# Patient Record
Sex: Female | Born: 1985 | Hispanic: No | Marital: Married | Smoking: Never smoker
Health system: Southern US, Community
[De-identification: ages and names within clinical notes are randomized; demographics above are authoritative.]

## PROBLEM LIST (undated history)

## (undated) DIAGNOSIS — R519 Headache, unspecified: Secondary | ICD-10-CM

## (undated) DIAGNOSIS — I10 Essential (primary) hypertension: Secondary | ICD-10-CM

## (undated) DIAGNOSIS — O139 Gestational [pregnancy-induced] hypertension without significant proteinuria, unspecified trimester: Secondary | ICD-10-CM

## (undated) DIAGNOSIS — Z8669 Personal history of other diseases of the nervous system and sense organs: Secondary | ICD-10-CM

## (undated) DIAGNOSIS — R51 Headache: Secondary | ICD-10-CM

## (undated) HISTORY — DX: Gestational (pregnancy-induced) hypertension without significant proteinuria, unspecified trimester: O13.9

## (undated) HISTORY — DX: Personal history of other diseases of the nervous system and sense organs: Z86.69

## (undated) HISTORY — PX: BREAST BIOPSY: SHX20

---

## 2011-12-23 DIAGNOSIS — N6019 Diffuse cystic mastopathy of unspecified breast: Secondary | ICD-10-CM | POA: Insufficient documentation

## 2012-10-17 DIAGNOSIS — F419 Anxiety disorder, unspecified: Secondary | ICD-10-CM | POA: Insufficient documentation

## 2012-11-14 DIAGNOSIS — F329 Major depressive disorder, single episode, unspecified: Secondary | ICD-10-CM | POA: Insufficient documentation

## 2012-11-14 DIAGNOSIS — F32A Depression, unspecified: Secondary | ICD-10-CM | POA: Insufficient documentation

## 2013-06-20 NOTE — L&D Delivery Note (Signed)
Delivery Note At 5:53 AM a viable female was delivered via SVD  (Presentation: cephalic; ROA ).  APGAR: 9, 9; weight pending .   Placenta status: spontaneous , intact.  Cord:  3 vessel with the following complications: none.  Cord pH: pending  Anesthesia:  none Episiotomy:  none Lacerations:  none Suture Repair: n/a Est. Blood Loss (mL):  200  28 y.o. G2P1001 @[redacted]w[redacted]d  admitted for IOL for preeclampsia with severe range BPs.  Pt had FB, Cytotec, and Pitocin and progressed to 5 cm.  AROM performed, then variables started on FHR tracing.  Amnioinfusion started, then pt rapidly progressed to 10 cm and delivered.  Infant vigorous and placed in mothers arms after delivery.  Since delivery was rapid, NICU team called to room after delivery. Cord clamping delayed by several minutes, then clamped by MD and cut by FOB before infant taken to warmer for evaluation by NICU team.  Placenta spontaneous and intact.  Pitocin IV given after placenta delivery.  Pt given time to hold infant again prior to transfer to NICU.  FOB to NICU with baby.   Mom to postpartum.  Baby to NICU.  Kara Brady, Angela 06/04/2014, 6:09 AM   I have seen this patient and agree with the above resident's note.  LEFTWICH-KIRBY, LISA Certified Nurse-Midwife

## 2014-01-31 ENCOUNTER — Encounter: Payer: Self-pay | Admitting: Obstetrics and Gynecology

## 2014-01-31 ENCOUNTER — Ambulatory Visit (INDEPENDENT_AMBULATORY_CARE_PROVIDER_SITE_OTHER): Payer: Managed Care, Other (non HMO) | Admitting: Obstetrics and Gynecology

## 2014-01-31 VITALS — BP 121/80 | HR 83 | Ht 62.0 in | Wt 185.0 lb

## 2014-01-31 DIAGNOSIS — Z3482 Encounter for supervision of other normal pregnancy, second trimester: Secondary | ICD-10-CM | POA: Insufficient documentation

## 2014-01-31 DIAGNOSIS — R87612 Low grade squamous intraepithelial lesion on cytologic smear of cervix (LGSIL): Secondary | ICD-10-CM

## 2014-01-31 DIAGNOSIS — Z1151 Encounter for screening for human papillomavirus (HPV): Secondary | ICD-10-CM

## 2014-01-31 DIAGNOSIS — Z124 Encounter for screening for malignant neoplasm of cervix: Secondary | ICD-10-CM

## 2014-01-31 DIAGNOSIS — Z3480 Encounter for supervision of other normal pregnancy, unspecified trimester: Secondary | ICD-10-CM

## 2014-01-31 LAB — OB RESULTS CONSOLE HGB/HCT, BLOOD
HEMATOCRIT: 36 %
HEMOGLOBIN: 12.4 g/dL

## 2014-01-31 LAB — OB RESULTS CONSOLE HIV ANTIBODY (ROUTINE TESTING): HIV: NONREACTIVE

## 2014-01-31 LAB — OB RESULTS CONSOLE GC/CHLAMYDIA
Chlamydia: NEGATIVE
GC PROBE AMP, GENITAL: NEGATIVE

## 2014-01-31 LAB — OB RESULTS CONSOLE RPR: RPR: NONREACTIVE

## 2014-01-31 LAB — OB RESULTS CONSOLE PLATELET COUNT: PLATELETS: 306 10*3/uL

## 2014-01-31 MED ORDER — PRENATAL VITAMINS PLUS 27-1 MG PO TABS: 1.0000 | ORAL_TABLET | ORAL | Status: DC

## 2014-01-31 NOTE — Progress Notes (Signed)
   Subjective:    Kara Brady is a G2P0001 3056w3d being seen today for her first obstetrical visit.  Her obstetrical history is significant for term NSVD AGA. [place]. Patient does intend to breast feed. Pregnancy history fully reviewed.  Patient reports no complaints.  Filed Vitals:   01/31/14 1008 01/31/14 1009  BP: 121/80   Pulse: 83   Height:  5\' 2"  (1.575 m)  Weight: 185 lb (83.915 kg)     HISTORY: OB History  Gravida Para Term Preterm AB SAB TAB Ectopic Multiple Living  2 1   0     1    # Outcome Date GA Lbr Len/2nd Weight Sex Delivery Anes PTL Lv  2 CUR           1 PAR 01/01/10 7144w1d  6 lb 13 oz (3.09 kg) M SVD        History reviewed. No pertinent past medical history. Past Surgical History  Procedure Laterality Date  . Breast biopsy     Family History  Problem Relation Age of Onset  . Hypertension Mother   . Diabetes Paternal Grandfather      Exam    Uterus:   16 wk size DT 155  Pelvic Exam:    Perineum: Normal Perineum   Vulva: normal, Bartholin's, Urethra, Skene's normal   Vagina:  normal mucosa, normal discharge       Cervix: multiparous appearance and Nabothian cyst ant lip L/C   Adnexa: not evaluated   Bony Pelvis: average  System: Breast:  normal appearance, no masses or tenderness scar left breast d/t benigh lump removed 2 yr ago   Skin: normal coloration and turgor, no rashes    Neurologic: oriented, normal, grossly non-focal   Extremities: normal strength, tone, and muscle mass   HEENT PERRLA   Mouth/Teeth mucous membranes moist, pharynx normal without lesions and dental hygiene good   Neck supple, no masses and thyroid NS   Cardiovascular: regular rate and rhythm, no murmurs or gallops   Respiratory:  appears well, vitals normal, no respiratory distress, acyanotic, normal RR, ear and throat exam is normal, neck free of mass or lymphadenopathy, chest clear, no wheezing, crepitations, rhonchi, normal symmetric air entry   Abdomen:  soft, non-tender; bowel sounds normal; no masses,  no organomegaly   Urinary: urethral meatus normal      Assessment:    Pregnancy: G2P0001 Patient Active Problem List   Diagnosis Date Noted  . Supervision of normal intrauterine pregnancy in multigravida in second trimester 01/31/2014        Plan:     Initial labs drawn. Prenatal vitamins Rx Problem list reviewed and updated. Genetic Screening discussed Quad Screen: declined.  Ultrasound discussed; fetal survey: requested. Will schedule  Follow up in 4 weeks. 50% of 30 min visit spent on counseling and coordination of care.     Kara Brady 01/31/2014

## 2014-01-31 NOTE — Patient Instructions (Signed)
Second Trimester of Pregnancy The second trimester is from week 13 through week 28, months 4 through 6. The second trimester is often a time when you feel your best. Your body has also adjusted to being pregnant, and you begin to feel better physically. Usually, morning sickness has lessened or quit completely, you may have more energy, and you may have an increase in appetite. The second trimester is also a time when the fetus is growing rapidly. At the end of the sixth month, the fetus is about 9 inches long and weighs about 1 pounds. You will likely begin to feel the baby move (quickening) between 18 and 20 weeks of the pregnancy. BODY CHANGES Your body goes through many changes during pregnancy. The changes vary from woman to woman.   Your weight will continue to increase. You will notice your lower abdomen bulging out.  You may begin to get stretch marks on your hips, abdomen, and breasts.  You may develop headaches that can be relieved by medicines approved by your health care provider.  You may urinate more often because the fetus is pressing on your bladder.  You may develop or continue to have heartburn as a result of your pregnancy.  You may develop constipation because certain hormones are causing the muscles that push waste through your intestines to slow down.  You may develop hemorrhoids or swollen, bulging veins (varicose veins).  You may have back pain because of the weight gain and pregnancy hormones relaxing your joints between the bones in your pelvis and as a result of a shift in weight and the muscles that support your balance.  Your breasts will continue to grow and be tender.  Your gums may bleed and may be sensitive to brushing and flossing.  Dark spots or blotches (chloasma, mask of pregnancy) may develop on your face. This will likely fade after the baby is born.  A dark line from your belly button to the pubic area (linea nigra) may appear. This will likely fade  after the baby is born.  You may have changes in your hair. These can include thickening of your hair, rapid growth, and changes in texture. Some women also have hair loss during or after pregnancy, or hair that feels dry or thin. Your hair will most likely return to normal after your baby is born. WHAT TO EXPECT AT YOUR PRENATAL VISITS During a routine prenatal visit:  You will be weighed to make sure you and the fetus are growing normally.  Your blood pressure will be taken.  Your abdomen will be measured to track your baby's growth.  The fetal heartbeat will be listened to.  Any test results from the previous visit will be discussed. Your health care provider may ask you:  How you are feeling.  If you are feeling the baby move.  If you have had any abnormal symptoms, such as leaking fluid, bleeding, severe headaches, or abdominal cramping.  If you have any questions. Other tests that may be performed during your second trimester include:  Blood tests that check for:  Low iron levels (anemia).  Gestational diabetes (between 24 and 28 weeks).  Rh antibodies.  Urine tests to check for infections, diabetes, or protein in the urine.  An ultrasound to confirm the proper growth and development of the baby.  An amniocentesis to check for possible genetic problems.  Fetal screens for spina bifida and Down syndrome. HOME CARE INSTRUCTIONS   Avoid all smoking, herbs, alcohol, and unprescribed   drugs. These chemicals affect the formation and growth of the baby.  Follow your health care provider's instructions regarding medicine use. There are medicines that are either safe or unsafe to take during pregnancy.  Exercise only as directed by your health care provider. Experiencing uterine cramps is a good sign to stop exercising.  Continue to eat regular, healthy meals.  Wear a good support bra for breast tenderness.  Do not use hot tubs, steam rooms, or saunas.  Wear your  seat belt at all times when driving.  Avoid raw meat, uncooked cheese, cat litter boxes, and soil used by cats. These carry germs that can cause birth defects in the baby.  Take your prenatal vitamins.  Try taking a stool softener (if your health care provider approves) if you develop constipation. Eat more high-fiber foods, such as fresh vegetables or fruit and whole grains. Drink plenty of fluids to keep your urine clear or pale yellow.  Take warm sitz baths to soothe any pain or discomfort caused by hemorrhoids. Use hemorrhoid cream if your health care provider approves.  If you develop varicose veins, wear support hose. Elevate your feet for 15 minutes, 3-4 times a day. Limit salt in your diet.  Avoid heavy lifting, wear low heel shoes, and practice good posture.  Rest with your legs elevated if you have leg cramps or low back pain.  Visit your dentist if you have not gone yet during your pregnancy. Use a soft toothbrush to brush your teeth and be gentle when you floss.  A sexual relationship may be continued unless your health care provider directs you otherwise.  Continue to go to all your prenatal visits as directed by your health care provider. SEEK MEDICAL CARE IF:   You have dizziness.  You have mild pelvic cramps, pelvic pressure, or nagging pain in the abdominal area.  You have persistent nausea, vomiting, or diarrhea.  You have a bad smelling vaginal discharge.  You have pain with urination. SEEK IMMEDIATE MEDICAL CARE IF:   You have a fever.  You are leaking fluid from your vagina.  You have spotting or bleeding from your vagina.  You have severe abdominal cramping or pain.  You have rapid weight gain or loss.  You have shortness of breath with chest pain.  You notice sudden or extreme swelling of your face, hands, ankles, feet, or legs.  You have not felt your baby move in over an hour.  You have severe headaches that do not go away with  medicine.  You have vision changes. Document Released: 05/31/2001 Document Revised: 06/11/2013 Document Reviewed: 08/07/2012 ExitCare Patient Information 2015 [employer]. This information is not intended to replace advice given to you by your health care provider. Make sure you discuss any questions you have with your health care provider.  

## 2014-02-01 LAB — OBSTETRIC PANEL
Antibody Screen: NEGATIVE
Basophils Absolute: 0 10*3/uL (ref 0.0–0.1)
Basophils Relative: 0 % (ref 0–1)
EOS PCT: 0 % (ref 0–5)
Eosinophils Absolute: 0 10*3/uL (ref 0.0–0.7)
HCT: 36.1 % (ref 36.0–46.0)
HEMOGLOBIN: 12.4 g/dL (ref 12.0–15.0)
HEP B S AG: NEGATIVE
LYMPHS ABS: 2.9 10*3/uL (ref 0.7–4.0)
LYMPHS PCT: 33 % (ref 12–46)
MCH: 29.6 pg (ref 26.0–34.0)
MCHC: 34.3 g/dL (ref 30.0–36.0)
MCV: 86.2 fL (ref 78.0–100.0)
Monocytes Absolute: 0.6 10*3/uL (ref 0.1–1.0)
Monocytes Relative: 7 % (ref 3–12)
NEUTROS PCT: 60 % (ref 43–77)
Neutro Abs: 5.3 10*3/uL (ref 1.7–7.7)
Platelets: 306 10*3/uL (ref 150–400)
RBC: 4.19 MIL/uL (ref 3.87–5.11)
RDW: 14.3 % (ref 11.5–15.5)
RUBELLA: 0.88 {index} (ref <0.90)
Rh Type: POSITIVE
WBC: 8.8 10*3/uL (ref 4.0–10.5)

## 2014-02-01 LAB — HIV ANTIBODY (ROUTINE TESTING W REFLEX): HIV 1&2 Ab, 4th Generation: NONREACTIVE

## 2014-02-01 LAB — GC/CHLAMYDIA PROBE AMP
CT Probe RNA: NEGATIVE
GC Probe RNA: NEGATIVE

## 2014-02-02 LAB — CULTURE, OB URINE
COLONY COUNT: NO GROWTH
ORGANISM ID, BACTERIA: NO GROWTH

## 2014-02-03 LAB — CYTOLOGY - PAP

## 2014-02-05 DIAGNOSIS — R87612 Low grade squamous intraepithelial lesion on cytologic smear of cervix (LGSIL): Secondary | ICD-10-CM | POA: Insufficient documentation

## 2014-03-03 ENCOUNTER — Ambulatory Visit (INDEPENDENT_AMBULATORY_CARE_PROVIDER_SITE_OTHER): Payer: Managed Care, Other (non HMO) | Admitting: Obstetrics & Gynecology

## 2014-03-03 ENCOUNTER — Ambulatory Visit (HOSPITAL_COMMUNITY)
Admission: RE | Admit: 2014-03-03 | Discharge: 2014-03-03 | Disposition: A | Discharge status: Home / Self Care | Payer: Managed Care, Other (non HMO) | Source: Ambulatory Visit | Attending: Obstetrics and Gynecology | Admitting: Obstetrics and Gynecology

## 2014-03-03 ENCOUNTER — Encounter: Payer: Self-pay | Admitting: Obstetrics & Gynecology

## 2014-03-03 VITALS — BP 128/81 | HR 91 | Wt 189.0 lb

## 2014-03-03 DIAGNOSIS — R87612 Low grade squamous intraepithelial lesion on cytologic smear of cervix (LGSIL): Secondary | ICD-10-CM

## 2014-03-03 DIAGNOSIS — Z3482 Encounter for supervision of other normal pregnancy, second trimester: Secondary | ICD-10-CM

## 2014-03-03 DIAGNOSIS — Z3689 Encounter for other specified antenatal screening: Secondary | ICD-10-CM | POA: Diagnosis not present

## 2014-03-03 NOTE — Progress Notes (Signed)
  Colposcopy Procedure Note  Indications: Pap smear 1 months ago showed: low-grade squamous intraepithelial neoplasia (LGSIL - encompassing HPV,mild dysplasia,CIN I) with HPV HR positive.  Pt denies other abnormal pap smears or procedures  Procedure Details  The risks and benefits of the procedure and Written informed consent obtained.  Speculum placed in vagina and excellent visualization of cervix achieved, cervix swabbed x 3 with acetic acid solution.  Findings: Cervix: acetowhite lesion(s) noted at 3 o'clock; cervix swabbed with Lugol's solution, SCJ visualized - lesion at 3 o'clock and cervical biopsies taken at 3 o'clock. Vaginal inspection: vaginal colposcopy not performed. Vulvar colposcopy: vulvar colposcopy not performed.  Specimens: difficult cervical biopsy taken at 3 o clock  Complications: none.  Plan: Specimens labelled and sent to Pathology. Will base further treatment on Pathology findings.

## 2014-03-03 NOTE — Progress Notes (Signed)
Pt to have Colpo today - Pt c/o of nausea an vomiting and would like to change PNV to specifically Cintranatal and also wants medication for nausea

## 2014-03-03 NOTE — Addendum Note (Signed)
Addended by: Arne Cleveland on: 03/03/2014 02:22 PM   Modules accepted: Orders

## 2014-03-10 ENCOUNTER — Ambulatory Visit (INDEPENDENT_AMBULATORY_CARE_PROVIDER_SITE_OTHER): Payer: Managed Care, Other (non HMO) | Admitting: Advanced Practice Midwife

## 2014-03-10 ENCOUNTER — Telehealth: Payer: Self-pay | Admitting: *Deleted

## 2014-03-10 ENCOUNTER — Encounter: Payer: Self-pay | Admitting: *Deleted

## 2014-03-10 ENCOUNTER — Encounter: Payer: Self-pay | Admitting: Obstetrics & Gynecology

## 2014-03-10 VITALS — BP 115/73 | HR 81 | Wt 190.0 lb

## 2014-03-10 DIAGNOSIS — Z3480 Encounter for supervision of other normal pregnancy, unspecified trimester: Secondary | ICD-10-CM

## 2014-03-10 DIAGNOSIS — O26892 Other specified pregnancy related conditions, second trimester: Secondary | ICD-10-CM

## 2014-03-10 DIAGNOSIS — R51 Headache: Secondary | ICD-10-CM

## 2014-03-10 DIAGNOSIS — O9989 Other specified diseases and conditions complicating pregnancy, childbirth and the puerperium: Secondary | ICD-10-CM

## 2014-03-10 MED ORDER — CYCLOBENZAPRINE HCL 10 MG PO TABS: 5.0000 mg | ORAL_TABLET | Freq: Three times a day (TID) | ORAL | Status: DC | PRN

## 2014-03-10 MED ORDER — CVS PRENATAL GUMMY 0.4-113.5 MG PO CHEW: 2.0000 | CHEWABLE_TABLET | Freq: Every day | ORAL | Status: DC

## 2014-03-10 NOTE — Telephone Encounter (Signed)
LM on voiceamil of LGSIL and no further testing at this time.  Will repeat pap in 1 year.

## 2014-03-10 NOTE — Progress Notes (Signed)
Pt here due to headache X1 month - pt states she takes about 3 tylenol each day for relief, pt experiences dizziness and anxiety. Sx begin each day around 2:00pm.

## 2014-03-10 NOTE — Telephone Encounter (Signed)
Message copied by Granville Lewis on Mon Mar 10, 2014 12:08 PM ------      Message from: Lesly Dukes      Created: Mon Mar 10, 2014 10:22 AM       Biopsy showed low grade.  No further testing to be done in pregnancy.  Will rpt pap in one year. ------

## 2014-03-10 NOTE — Progress Notes (Signed)
Doing well.  Good fetal movement, denies vaginal bleeding, LOF, regular contractions.  Reports daily headaches, accompanied by nausea.  No hx migraines but had intermittent headaches prior to pregnancy. Also, she often vomits after prenatal vitamin, has tried taking with food, taking at night, etc.  Rx for Flexeril 5-10 mg TID.  Appt to see Bonita Quin r/t headaches.  Change prenatal vitamin to gummy vitamin.  Discussed lack of iron in gummy vitamin.  Will reevaluate after 28 week labs.

## 2014-03-25 ENCOUNTER — Ambulatory Visit (INDEPENDENT_AMBULATORY_CARE_PROVIDER_SITE_OTHER): Payer: Managed Care, Other (non HMO) | Admitting: Nurse Practitioner

## 2014-03-25 ENCOUNTER — Encounter: Payer: Self-pay | Admitting: Nurse Practitioner

## 2014-03-25 VITALS — BP 144/94 | HR 81 | Resp 16 | Ht 62.0 in | Wt 196.0 lb

## 2014-03-25 DIAGNOSIS — F32A Depression, unspecified: Secondary | ICD-10-CM

## 2014-03-25 DIAGNOSIS — G43009 Migraine without aura, not intractable, without status migrainosus: Secondary | ICD-10-CM | POA: Insufficient documentation

## 2014-03-25 DIAGNOSIS — F419 Anxiety disorder, unspecified: Secondary | ICD-10-CM

## 2014-03-25 DIAGNOSIS — F329 Major depressive disorder, single episode, unspecified: Secondary | ICD-10-CM

## 2014-03-25 DIAGNOSIS — G43019 Migraine without aura, intractable, without status migrainosus: Secondary | ICD-10-CM

## 2014-03-25 MED ORDER — SUMATRIPTAN SUCCINATE 100 MG PO TABS: 100.0000 mg | ORAL_TABLET | Freq: Once | ORAL | Status: DC | PRN

## 2014-03-25 MED ORDER — PROMETHAZINE HCL 25 MG PO TABS: 25.0000 mg | ORAL_TABLET | Freq: Four times a day (QID) | ORAL | Status: DC | PRN

## 2014-03-25 MED ORDER — VENLAFAXINE HCL ER 75 MG PO CP24: 75.0000 mg | ORAL_CAPSULE | Freq: Every day | ORAL | Status: DC

## 2014-03-25 NOTE — Progress Notes (Signed)
FHT 141 and fundal height today is 25 mm.  Pt is to return next week for her routine prenatal visit.

## 2014-03-25 NOTE — Progress Notes (Signed)
Diagnosis:  Chronic Migraine without Aura, Anxiety, Depression, Pregnancy  History: Kara Brady 28 y.o. G2P0001 2650w0d [facility] for migraine consultation. She has been having almost daily migraines since her 20's. She has had CT/MRI she can not recall which. She also has some vertigo and anxiety along with migraine. 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 She has a 29 year old at home that is giving her a lot of issues. She has a new/ good marriage. They have some debt and she worries a good deal about money. She has a history of anxiety and depression. Her sleep is good. She has been given flexeril for migraine but it has not been effective.      Location: Right and left [place]  Number of Headache days/month: Daily Severe: 20 Moderate: 5 Mild:10  Current Outpatient Prescriptions on File Prior to Visit  Medication Sig Dispense Refill  . acetaminophen (TYLENOL) 500 MG tablet Take 500 mg by mouth every 6 (six) hours as needed.      . cyclobenzaprine (FLEXERIL) 10 MG tablet Take 0.5-1 tablets (5-10 mg total) by mouth 3 (three) times daily as needed for muscle spasms.  30 tablet  2  . Prenatal Vit-Min-FA-Fish Oil ([facility] PRENATAL GUMMY) 0.4-113.5 MG CHEW Chew 2 tablets by mouth daily.  60 tablet  10   No current facility-administered medications on file prior to visit.    Acute prevention: Flexeril  Past Medical History  Diagnosis Date  . History of migraine headaches    Past Surgical History  Procedure Laterality Date  . Breast biopsy     Family History  Problem Relation Age of Onset  . Hypertension Mother   . Diabetes Paternal Grandfather    Social History:  reports that she has never smoked. She does not have any smokeless tobacco history on file. She reports that she does not drink alcohol or use illicit drugs. Allergies: No Known Allergies  Triggers: Stress and anxiety  Birth control: Pregnant  ROS: Positive for Anxiety,  Chronic migraine, pregnancy, depression, vertigo. Negative for cardiac issues. Today BP elevated due to migraine.   Exam: Well developed, well nourished, Hispanic female,  General: [redacted] weeks pregnant, NAD, has migraine today HEENT:negative Cardiac:RRR Lungs:Clear Neuro:Negative Skin:Warm and dry  Impression:migraine - common/ Chronic and without aura Anxiety/ Depression Pregnancy 23 weeks  Plan: Discussed the pathophysiology of migraine and medication risks in pregnancy and she is willing to accept these risks. We have discussed treating her depression and anxiety with Effexor and she will take 1/2 tab for one week then up to one tab till seen again. Her BP was elevated today, but she has a migraine. She was asked to have her BP repeated before leaving office but slipped out before that was done. We will give her Imitrex and phenergan to treat severe migraine while she is at work. We discussed at length ways to decrease her stress including asking her husband for help, walking, taking baths, joining church, meditation. She has an OB visit in one week and she will follow up with me in one month.   Time Spent: 45 minutes

## 2014-03-25 NOTE — Patient Instructions (Signed)

## 2014-03-31 ENCOUNTER — Encounter: Payer: Managed Care, Other (non HMO) | Admitting: Advanced Practice Midwife

## 2014-04-22 ENCOUNTER — Encounter: Payer: Self-pay | Admitting: Nurse Practitioner

## 2014-04-22 ENCOUNTER — Ambulatory Visit (INDEPENDENT_AMBULATORY_CARE_PROVIDER_SITE_OTHER): Payer: Managed Care, Other (non HMO) | Admitting: Nurse Practitioner

## 2014-04-22 ENCOUNTER — Ambulatory Visit (INDEPENDENT_AMBULATORY_CARE_PROVIDER_SITE_OTHER): Payer: Managed Care, Other (non HMO) | Admitting: Obstetrics & Gynecology

## 2014-04-22 VITALS — BP 136/81 | HR 97 | Wt 201.0 lb

## 2014-04-22 VITALS — BP 136/81 | HR 97 | Resp 16 | Ht 64.0 in | Wt 201.0 lb

## 2014-04-22 DIAGNOSIS — R51 Headache: Secondary | ICD-10-CM

## 2014-04-22 DIAGNOSIS — F32A Depression, unspecified: Secondary | ICD-10-CM

## 2014-04-22 DIAGNOSIS — G43019 Migraine without aura, intractable, without status migrainosus: Secondary | ICD-10-CM

## 2014-04-22 DIAGNOSIS — Z3482 Encounter for supervision of other normal pregnancy, second trimester: Secondary | ICD-10-CM

## 2014-04-22 DIAGNOSIS — F329 Major depressive disorder, single episode, unspecified: Secondary | ICD-10-CM

## 2014-04-22 DIAGNOSIS — O26892 Other specified pregnancy related conditions, second trimester: Secondary | ICD-10-CM

## 2014-04-22 DIAGNOSIS — R7302 Impaired glucose tolerance (oral): Secondary | ICD-10-CM

## 2014-04-22 DIAGNOSIS — R519 Headache, unspecified: Secondary | ICD-10-CM

## 2014-04-22 DIAGNOSIS — Z23 Encounter for immunization: Secondary | ICD-10-CM

## 2014-04-22 DIAGNOSIS — R319 Hematuria, unspecified: Secondary | ICD-10-CM

## 2014-04-22 DIAGNOSIS — R7309 Other abnormal glucose: Secondary | ICD-10-CM

## 2014-04-22 DIAGNOSIS — F419 Anxiety disorder, unspecified: Secondary | ICD-10-CM

## 2014-04-22 DIAGNOSIS — Z3493 Encounter for supervision of normal pregnancy, unspecified, third trimester: Secondary | ICD-10-CM

## 2014-04-22 NOTE — Progress Notes (Signed)
Ketones -Trace  Blood -mod

## 2014-04-22 NOTE — Progress Notes (Signed)
History:  Kara Brady is a 28 y.o. G2P0001 who [facility] for follow up with migraine headache. She is up to 75 mg of Effexor and doing much better. She has been able to take her Imitrex and phenergan when she gets a migraine. She states she is feeling 75% less anxious and less headache. She would like to stay on Effexor as long as she can. She plans to breast feed.   The following portions of the patient's history were reviewed and updated as appropriate: allergies, current medications, past family history, past medical history, past social history, past surgical history and problem list.  Review of Systems:    Objective:  Physical Exam BP 136/81 mmHg  Pulse 97  Resp 16  Ht 5\' 4"  (1.626 m)  Wt 201 lb (91.173 kg)  BMI 34.48 kg/m2  LMP 10/15/2013 GENERAL: Well-developed, well-nourished female in no acute distress. Pregnant [redacted] weeks HEENT: Normocephalic, atraumatic.  NECK: Supple. Normal thyroid.  LUNGS: Normal rate. Clear to auscultation bilaterally.  HEART: Regular rate and rhythm with no adventitious sounds.   EXTREMITIES: No cyanosis, clubbing, or edema, 2+ distal pulses.   Labs and Imaging No results found.  Assessment & Plan:  Assessment:  Migraine Headache in Pregnancy/ Improved  Plans:  Pt would like to stay on Effexor as long as possible At week 37-38 she will take 1/2 Effexor dose for one week then discontinue She will call if she has any issues with weaning/ depression/ anxiety/ worsening migraines If she would like to replace Effexor with Zoloft after delivery will be ok with breast feeding Continue to keep Prenatal visits   Delbert PhenixLinda M Zakyra [place], NP 04/22/2014 2:33 PM

## 2014-04-22 NOTE — Progress Notes (Signed)
Routine visit. Good FM. Glucola, labs, tdap today.

## 2014-04-22 NOTE — Patient Instructions (Signed)

## 2014-04-23 ENCOUNTER — Telehealth: Payer: Self-pay | Admitting: *Deleted

## 2014-04-23 LAB — CBC
HEMATOCRIT: 35.8 % — AB (ref 36.0–46.0)
HEMOGLOBIN: 12 g/dL (ref 12.0–15.0)
MCH: 29.8 pg (ref 26.0–34.0)
MCHC: 33.5 g/dL (ref 30.0–36.0)
MCV: 88.8 fL (ref 78.0–100.0)
Platelets: 268 10*3/uL (ref 150–400)
RBC: 4.03 MIL/uL (ref 3.87–5.11)
RDW: 14.1 % (ref 11.5–15.5)
WBC: 10.1 10*3/uL (ref 4.0–10.5)

## 2014-04-23 LAB — HIV ANTIBODY (ROUTINE TESTING W REFLEX): HIV 1&2 Ab, 4th Generation: NONREACTIVE

## 2014-04-23 LAB — RPR

## 2014-04-23 LAB — GLUCOSE TOLERANCE, 1 HOUR (50G) W/O FASTING: Glucose, 1 Hour GTT: 180 mg/dL — ABNORMAL HIGH (ref 70–140)

## 2014-04-23 NOTE — Addendum Note (Signed)
Addended by: Granville LewisLARK, Torian Thoennes L on: 04/23/2014 02:12 PM   Modules accepted: Orders

## 2014-04-23 NOTE — Telephone Encounter (Signed)
Lm on voicemail of abnormal 1 hr GTT and needs to schedule a fasting 3 hr GTT.

## 2014-04-24 ENCOUNTER — Encounter: Payer: Self-pay | Admitting: Obstetrics & Gynecology

## 2014-04-24 DIAGNOSIS — O99810 Abnormal glucose complicating pregnancy: Secondary | ICD-10-CM | POA: Insufficient documentation

## 2014-04-24 LAB — CULTURE, URINE COMPREHENSIVE
Colony Count: NO GROWTH
ORGANISM ID, BACTERIA: NO GROWTH

## 2014-04-25 ENCOUNTER — Telehealth: Payer: Self-pay

## 2014-04-25 LAB — GLUCOSE TOLERANCE, 3 HOURS
GLUCOSE, FASTING-GESTATIONAL: 79 mg/dL (ref 70–104)
Glucose Tolerance, 1 hour: 125 mg/dL (ref 70–189)
Glucose Tolerance, 2 hour: 121 mg/dL (ref 70–164)
Glucose, GTT - 3 Hour: 119 mg/dL (ref 70–144)

## 2014-04-25 NOTE — Telephone Encounter (Signed)
Called patient to let her know her she passed her 3-hr gtt. Patient is ok with the results

## 2014-05-13 ENCOUNTER — Ambulatory Visit (INDEPENDENT_AMBULATORY_CARE_PROVIDER_SITE_OTHER): Payer: Managed Care, Other (non HMO) | Admitting: Obstetrics & Gynecology

## 2014-05-13 VITALS — BP 146/92 | HR 98 | Wt 212.0 lb

## 2014-05-13 DIAGNOSIS — Z3482 Encounter for supervision of other normal pregnancy, second trimester: Secondary | ICD-10-CM

## 2014-05-13 NOTE — Patient Instructions (Signed)

## 2014-05-13 NOTE — Progress Notes (Signed)
BP slightly elevated.  Will get labs, protein creatinine ratio, and 24 hour urine.  Pt will return tomorrow for BP check and if elevated will do NST.  Pt denies headache or other signs of severe preeclampsia.  Fetal kick counts.

## 2014-05-14 ENCOUNTER — Ambulatory Visit: Payer: Managed Care, Other (non HMO) | Admitting: *Deleted

## 2014-05-14 VITALS — BP 134/86 | HR 88

## 2014-05-14 DIAGNOSIS — I158 Other secondary hypertension: Secondary | ICD-10-CM

## 2014-05-20 LAB — CREATININE CLEARANCE, URINE, 24 HOUR
CREAT CLEAR: 157 mL/min — AB (ref 75–115)
Creatinine, 24H Ur: 1127 mg/d (ref 700–1800)
Creatinine, Urine: 66.3 mg/dL
Creatinine: 0.5 mg/dL (ref 0.50–1.10)

## 2014-05-20 LAB — COMPREHENSIVE METABOLIC PANEL
ALBUMIN: 3 g/dL — AB (ref 3.5–5.2)
ALT: 11 U/L (ref 0–35)
AST: 12 U/L (ref 0–37)
Alkaline Phosphatase: 93 U/L (ref 39–117)
BUN: 8 mg/dL (ref 6–23)
CHLORIDE: 105 meq/L (ref 96–112)
CO2: 20 mEq/L (ref 19–32)
Calcium: 8.6 mg/dL (ref 8.4–10.5)
Creat: 0.5 mg/dL (ref 0.50–1.10)
GLUCOSE: 101 mg/dL — AB (ref 70–99)
POTASSIUM: 4.1 meq/L (ref 3.5–5.3)
Sodium: 137 mEq/L (ref 135–145)
TOTAL PROTEIN: 5.7 g/dL — AB (ref 6.0–8.3)
Total Bilirubin: 0.2 mg/dL (ref 0.2–1.2)

## 2014-05-20 LAB — CBC
HEMATOCRIT: 36.2 % (ref 36.0–46.0)
Hemoglobin: 12.1 g/dL (ref 12.0–15.0)
MCH: 28.9 pg (ref 26.0–34.0)
MCHC: 33.4 g/dL (ref 30.0–36.0)
MCV: 86.6 fL (ref 78.0–100.0)
MPV: 10.9 fL (ref 9.4–12.4)
Platelets: 275 10*3/uL (ref 150–400)
RBC: 4.18 MIL/uL (ref 3.87–5.11)
RDW: 14.4 % (ref 11.5–15.5)
WBC: 10.2 10*3/uL (ref 4.0–10.5)

## 2014-05-20 LAB — PROTEIN, URINE, 24 HOUR
Protein, 24H Urine: 323 mg/d — ABNORMAL HIGH (ref <150)
Protein, Urine: 19 mg/dL (ref 5–24)

## 2014-05-20 LAB — PROTEIN / CREATININE RATIO, URINE
Creatinine, Urine: 66.3 mg/dL
PROTEIN CREATININE RATIO: 0.29 — AB (ref <0.15)
TOTAL PROTEIN, URINE: 19 mg/dL (ref 5–24)

## 2014-05-23 ENCOUNTER — Other Ambulatory Visit: Payer: Self-pay | Admitting: Obstetrics & Gynecology

## 2014-05-23 ENCOUNTER — Ambulatory Visit (HOSPITAL_COMMUNITY)
Admission: RE | Admit: 2014-05-23 | Discharge: 2014-05-23 | Disposition: A | Discharge status: Home / Self Care | Payer: Managed Care, Other (non HMO) | Source: Ambulatory Visit | Attending: Obstetrics & Gynecology | Admitting: Obstetrics & Gynecology

## 2014-05-23 ENCOUNTER — Telehealth: Payer: Self-pay | Admitting: *Deleted

## 2014-05-23 DIAGNOSIS — O1403 Mild to moderate pre-eclampsia, third trimester: Secondary | ICD-10-CM | POA: Diagnosis not present

## 2014-05-23 DIAGNOSIS — Z3A32 32 weeks gestation of pregnancy: Secondary | ICD-10-CM | POA: Insufficient documentation

## 2014-05-23 NOTE — Telephone Encounter (Signed)
Called pt to adv [facility] but pt did not answer the phone. LMOM for pt to rtn call to set up testing.

## 2014-05-23 NOTE — Telephone Encounter (Signed)
-----   Message from Lesly DukesKelly H Leggett, MD sent at 05/23/2014 11:38 AM EST ----- 24 hour urine meets mild preeclampsia.  Pt needs 2x week testing.  Since it is Friday, she needs BPP and growth.  BP check.

## 2014-05-23 NOTE — Progress Notes (Signed)
[facility] and BPP).  Reporting system is currently down.  Report of normal growth and 8/8 BPP called to Dr. Jolayne Pantheronstant at [phone].

## 2014-05-26 ENCOUNTER — Ambulatory Visit (HOSPITAL_COMMUNITY): Payer: Managed Care, Other (non HMO)

## 2014-05-26 ENCOUNTER — Ambulatory Visit (INDEPENDENT_AMBULATORY_CARE_PROVIDER_SITE_OTHER): Payer: Managed Care, Other (non HMO) | Admitting: Advanced Practice Midwife

## 2014-05-26 ENCOUNTER — Encounter: Payer: Self-pay | Admitting: Advanced Practice Midwife

## 2014-05-26 ENCOUNTER — Other Ambulatory Visit (HOSPITAL_COMMUNITY): Payer: Managed Care, Other (non HMO)

## 2014-05-26 VITALS — BP 144/91 | HR 85 | Wt 213.1 lb

## 2014-05-26 DIAGNOSIS — O1403 Mild to moderate pre-eclampsia, third trimester: Secondary | ICD-10-CM

## 2014-05-26 DIAGNOSIS — Z3483 Encounter for supervision of other normal pregnancy, third trimester: Secondary | ICD-10-CM

## 2014-05-26 NOTE — Patient Instructions (Signed)

## 2014-05-26 NOTE — Progress Notes (Signed)
Discussed mild preeclampsia. Will start twice a week testing. Will do NST tomorrow and AFI Friday. 24 hr Protein = 323, Prot/Cr Ratio = 0.29.  Denies headache or vision changes now. Trace edema. US on 05/23/14 showed normal growth and BPP score of 8/8.

## 2014-05-27 ENCOUNTER — Ambulatory Visit (INDEPENDENT_AMBULATORY_CARE_PROVIDER_SITE_OTHER): Payer: Managed Care, Other (non HMO) | Admitting: *Deleted

## 2014-05-27 VITALS — BP 147/90 | HR 96 | Wt 214.0 lb

## 2014-05-27 DIAGNOSIS — O1493 Unspecified pre-eclampsia, third trimester: Secondary | ICD-10-CM

## 2014-05-27 DIAGNOSIS — Z3A32 32 weeks gestation of pregnancy: Secondary | ICD-10-CM | POA: Insufficient documentation

## 2014-05-27 DIAGNOSIS — O1400 Mild to moderate pre-eclampsia, unspecified trimester: Secondary | ICD-10-CM | POA: Insufficient documentation

## 2014-05-27 LAB — AMNIOTIC FLUID INDEX: AFP AMN (MCG/ML): 10.5

## 2014-05-27 MED ORDER — BETAMETHASONE SOD PHOS & ACET 6 (3-3) MG/ML IJ SUSP
12.0000 mg | Freq: Once | INTRAMUSCULAR | Status: AC
  Administered 2014-05-27: 12 mg via INTRAMUSCULAR

## 2014-05-27 NOTE — Addendum Note (Signed)
Addended by: Arne ClevelandHUTCHINSON, Tymon Nemetz J on: 05/27/2014 02:18 PM   Modules accepted: Orders

## 2014-05-28 ENCOUNTER — Observation Stay (HOSPITAL_COMMUNITY)
Admission: AD | Admit: 2014-05-28 | Discharge: 2014-05-30 | Disposition: A | Discharge status: Home / Self Care | Payer: Managed Care, Other (non HMO) | Source: Ambulatory Visit | Attending: Obstetrics & Gynecology | Admitting: Obstetrics & Gynecology

## 2014-05-28 ENCOUNTER — Ambulatory Visit (INDEPENDENT_AMBULATORY_CARE_PROVIDER_SITE_OTHER): Payer: Managed Care, Other (non HMO) | Admitting: Obstetrics & Gynecology

## 2014-05-28 ENCOUNTER — Other Ambulatory Visit: Payer: Managed Care, Other (non HMO)

## 2014-05-28 ENCOUNTER — Encounter (HOSPITAL_COMMUNITY): Payer: Self-pay | Admitting: *Deleted

## 2014-05-28 VITALS — BP 178/101 | HR 85 | Wt 212.0 lb

## 2014-05-28 DIAGNOSIS — Z36 Encounter for antenatal screening of mother: Secondary | ICD-10-CM

## 2014-05-28 DIAGNOSIS — O1493 Unspecified pre-eclampsia, third trimester: Secondary | ICD-10-CM

## 2014-05-28 DIAGNOSIS — Z3A33 33 weeks gestation of pregnancy: Secondary | ICD-10-CM | POA: Insufficient documentation

## 2014-05-28 DIAGNOSIS — Z3482 Encounter for supervision of other normal pregnancy, second trimester: Secondary | ICD-10-CM

## 2014-05-28 DIAGNOSIS — O99353 Diseases of the nervous system complicating pregnancy, third trimester: Secondary | ICD-10-CM | POA: Insufficient documentation

## 2014-05-28 DIAGNOSIS — O10913 Unspecified pre-existing hypertension complicating pregnancy, third trimester: Secondary | ICD-10-CM | POA: Diagnosis present

## 2014-05-28 DIAGNOSIS — O1490 Unspecified pre-eclampsia, unspecified trimester: Secondary | ICD-10-CM | POA: Diagnosis present

## 2014-05-28 DIAGNOSIS — G43909 Migraine, unspecified, not intractable, without status migrainosus: Secondary | ICD-10-CM | POA: Insufficient documentation

## 2014-05-28 HISTORY — DX: Essential (primary) hypertension: I10

## 2014-05-28 HISTORY — DX: Headache: R51

## 2014-05-28 HISTORY — DX: Headache, unspecified: R51.9

## 2014-05-28 LAB — CBC
HCT: 37.3 % (ref 36.0–46.0)
Hemoglobin: 12.4 g/dL (ref 12.0–15.0)
MCH: 29.7 pg (ref 26.0–34.0)
MCHC: 33.2 g/dL (ref 30.0–36.0)
MCV: 89.4 fL (ref 78.0–100.0)
Platelets: 258 10*3/uL (ref 150–400)
RBC: 4.17 MIL/uL (ref 3.87–5.11)
RDW: 14.4 % (ref 11.5–15.5)
WBC: 12.7 10*3/uL — ABNORMAL HIGH (ref 4.0–10.5)

## 2014-05-28 LAB — URINALYSIS, ROUTINE W REFLEX MICROSCOPIC
BILIRUBIN URINE: NEGATIVE
Glucose, UA: NEGATIVE mg/dL
Ketones, ur: NEGATIVE mg/dL
Leukocytes, UA: NEGATIVE
NITRITE: NEGATIVE
PH: 6.5 (ref 5.0–8.0)
Protein, ur: 100 mg/dL — AB
Specific Gravity, Urine: 1.025 (ref 1.005–1.030)
Urobilinogen, UA: 0.2 mg/dL (ref 0.0–1.0)

## 2014-05-28 LAB — URINE MICROSCOPIC-ADD ON

## 2014-05-28 LAB — COMPREHENSIVE METABOLIC PANEL
ALT: 11 U/L (ref 0–35)
ANION GAP: 15 (ref 5–15)
AST: 13 U/L (ref 0–37)
Albumin: 2.5 g/dL — ABNORMAL LOW (ref 3.5–5.2)
Alkaline Phosphatase: 111 U/L (ref 39–117)
BUN: 10 mg/dL (ref 6–23)
CO2: 21 mEq/L (ref 19–32)
CREATININE: 0.54 mg/dL (ref 0.50–1.10)
Calcium: 9 mg/dL (ref 8.4–10.5)
Chloride: 103 mEq/L (ref 96–112)
GFR calc Af Amer: >90 mL/min (ref >90)
Glucose, Bld: 132 mg/dL — ABNORMAL HIGH (ref 70–99)
Potassium: 4.2 mEq/L (ref 3.7–5.3)
Sodium: 139 mEq/L (ref 137–147)
TOTAL PROTEIN: 6.4 g/dL (ref 6.0–8.3)
Total Bilirubin: <0.2 mg/dL — ABNORMAL LOW (ref 0.3–1.2)

## 2014-05-28 LAB — PROTEIN / CREATININE RATIO, URINE
Creatinine, Urine: 77.03 mg/dL
PROTEIN CREATININE RATIO: 1.21 — AB (ref 0.00–0.15)
Total Protein, Urine: 93.2 mg/dL

## 2014-05-28 MED ORDER — BETAMETHASONE SOD PHOS & ACET 6 (3-3) MG/ML IJ SUSP
12.0000 mg | Freq: Once | INTRAMUSCULAR | Status: AC
  Administered 2014-05-28: 12 mg via INTRAMUSCULAR

## 2014-05-28 MED ORDER — ACETAMINOPHEN 325 MG PO TABS
650.0000 mg | ORAL_TABLET | ORAL | Status: DC | PRN
  Administered 2014-05-29 – 2014-05-30 (×3): 650 mg via ORAL
  Filled 2014-05-28 (×3): qty 2

## 2014-05-28 MED ORDER — PRENATAL MULTIVITAMIN CH
1.0000 | ORAL_TABLET | Freq: Every day | ORAL | Status: DC
  Administered 2014-05-28 – 2014-05-29 (×2): 1 via ORAL
  Filled 2014-05-28 (×2): qty 1

## 2014-05-28 MED ORDER — CALCIUM CARBONATE ANTACID 500 MG PO CHEW: 2.0000 | CHEWABLE_TABLET | ORAL | Status: DC | PRN

## 2014-05-28 MED ORDER — PRENATAL MULTIVITAMIN CH: 1.0000 | ORAL_TABLET | Freq: Every day | ORAL | Status: DC

## 2014-05-28 MED ORDER — ZOLPIDEM TARTRATE 5 MG PO TABS: 5.0000 mg | ORAL_TABLET | Freq: Every evening | ORAL | Status: DC | PRN

## 2014-05-28 MED ORDER — DOCUSATE SODIUM 100 MG PO CAPS
100.0000 mg | ORAL_CAPSULE | Freq: Every day | ORAL | Status: DC
  Administered 2014-05-29 – 2014-05-30 (×2): 100 mg via ORAL
  Filled 2014-05-28 (×4): qty 1

## 2014-05-28 NOTE — Progress Notes (Signed)
Pt noticing some visual floaters today

## 2014-05-28 NOTE — MAU Note (Signed)
Pt sent from MD office for elevated BP.  Pt has HA, blurred vision, had one episode of vomiting.  Denies uc's, bleeding or LOF.

## 2014-05-28 NOTE — Plan of Care (Signed)
Problem: Phase I Progression Outcomes Goal: Initial discharge plan identified Outcome: Completed/Met Date Met:  05/28/14  Problem: Phase II Progression Outcomes Goal: Mattress in use Mattress in use ( Eggcrate, Med/Surg, Regular, Birthing Suite, Other)  Outcome: Completed/Met Date Met:  05/28/14 Goal: Output > 30 ml/hr or voiding qs Outcome: Completed/Met Date Met:  05/28/14

## 2014-05-28 NOTE — H&P (Signed)
Kara Brady is a 28 y.o. female G2P0001 @[redacted]w[redacted]d  pt of [place] presenting to MAU sent from the office for elevated BP, h/a, and blurred vision.  She reports she woke up this morning with nausea/vomiting and a headache.  Then, she started having blurred vision shortly after waking up.  She was seen at the office today and sent to MAU for further evaluation.    Pt denies h/a, n/v, or blurred vision while in MAU.  She was diagnosed with preeclampsia on 05/23/14 with elevated 24 hour urine and started twice weekly testing.  She received BMZ injections on 12/8 and completed course on 12/9 at 10:00 am.    Clinic  [facility]  Dating By 21 week U/S on 03/03/14  Genetic Screen declined  Anatomic US nl  Glucose 1 hour-180; 3 hr--all nml  TDaP vaccine 04/22/14  Flu vaccine   GBS   Contraception   Baby Food breast  Circumcision   Pediatrician   Support Person     Maternal Medical History:  Reason for admission: Nausea.  Contractions: Onset was less than 1 hour ago.      OB History    Gravida Para Term Preterm AB TAB SAB Ectopic Multiple Living   2 1   0     1     Past Medical History  Diagnosis Date  . History of migraine headaches   . Hypertension   . Headache    Past Surgical History  Procedure Laterality Date  . Breast biopsy     Family History: family history includes Diabetes in her paternal grandfather; Hypertension in her mother. Social History:  reports that she has never smoked. She does not have any smokeless tobacco history on file. She reports that she does not drink alcohol or use illicit drugs.   Prenatal Transfer Tool  Maternal Diabetes: No Genetic Screening: Declined Maternal Ultrasounds/Referrals: Normal Fetal Ultrasounds or other Referrals:  None Maternal Substance Abuse:  No Significant Maternal Medications:  None Significant Maternal Lab Results:  Lab values include: Other:  Other Comments:  GBS unknown  Review of Systems  Constitutional: Negative for fever,  chills and malaise/fatigue.  Eyes: Negative for blurred vision.  Respiratory: Negative for cough and shortness of breath.   Cardiovascular: Negative for chest pain.  Gastrointestinal: Negative for heartburn, nausea, vomiting and abdominal pain.  Genitourinary: Negative for dysuria, urgency and frequency.  Musculoskeletal: Negative.   Neurological: Negative for dizziness and headaches.  Psychiatric/Behavioral: Negative for depression.      Blood pressure 148/98, pulse 91, temperature 98 F (36.7 C), resp. rate 18, last menstrual period 10/15/2013, SpO2 98 %. Maternal Exam:  Uterine Assessment: Contraction frequency is rare.   Abdomen: Patient reports no abdominal tenderness.   Fetal Exam Fetal Monitor Review: Mode: ultrasound.   Baseline rate: 140.  Variability: moderate (6-25 bpm).   Pattern: accelerations present and no decelerations.    Fetal State Assessment: Category I - tracings are normal.     Physical Exam  Nursing note and vitals reviewed. Constitutional: She is oriented to person, place, and time. She appears well-developed and well-nourished.  Neck: Normal range of motion.  Cardiovascular: Normal rate, regular rhythm and normal heart sounds.   Respiratory: Effort normal.  GI: Soft.  Musculoskeletal: Normal range of motion.  Neurological: She is alert and oriented to person, place, and time. She has normal reflexes.  Skin: Skin is warm and dry.  Psychiatric: She has a normal mood and affect. Her behavior is normal. Judgment and thought  content normal.    Prenatal labs: ABO, Rh: O/POS/-- (08/14 1126) Antibody: NEG (08/14 1126) Rubella: 0.88 (08/14 1126) RPR: NON REAC (11/03 1501)  HBsAg: NEGATIVE (08/14 1126)  HIV: NONREACTIVE (11/03 1501)  GBS:   unkown  Assessment/Plan: 1. Preeclampsia, third trimester    Admit to antepartum 24 hour urine GBS pending, collected in office 12/9    LEFTWICH-KIRBY, Laurieanne Galloway 05/28/2014, 2:54 PM

## 2014-05-28 NOTE — Progress Notes (Signed)
Routine visit. Good FM. Complains of visual changes today. Cultures done today. She will go to MAU to determine if she need IOL today or care on the Antenatal unit.

## 2014-05-28 NOTE — Plan of Care (Signed)
Problem: Consults Goal: Antepartum Patient Education Outcome: Completed/Met Date Met:  05/28/14 Goal: Birthing Suites Patient Information Press F2 to bring up selections list  Pt < [redacted] weeks EGA Goal: Skin Care Protocol Initiated - if Braden Score 18 or less If consults are not indicated, leave blank or document N/A Outcome: Not Applicable Date Met:  77/03/40 Goal: Diabetes Guidelines per MD order/protocol Outcome: Not Applicable Date Met:  35/24/81 Goal: Physical Therapy Consult Outcome: Not Applicable Date Met:  85/90/93 Goal: Orientation to unit: [place] (smoking, visitation, chaplain services, helpline) Outcome: Completed/Met Date Met:  05/28/14

## 2014-05-29 DIAGNOSIS — O1493 Unspecified pre-eclampsia, third trimester: Secondary | ICD-10-CM

## 2014-05-29 LAB — CREATININE CLEARANCE, URINE, 24 HOUR
COLLECTION INTERVAL-CRCL: 24 h
CREATININE, URINE: 41.73 mg/dL
Creatinine Clearance: 225 mL/min — ABNORMAL HIGH (ref 75–115)
Creatinine, 24H Ur: 1753 mg/d (ref 700–1800)
Creatinine: 0.54 mg/dL (ref 0.50–1.10)
Urine Total Volume-CRCL: 4200 mL

## 2014-05-29 LAB — GC/CHLAMYDIA PROBE AMP
CT Probe RNA: NEGATIVE
GC Probe RNA: NEGATIVE

## 2014-05-29 NOTE — Progress Notes (Signed)
FACULTY PRACTICE ANTEPARTUM COMPREHENSIVE PROGRESS NOTE  Kara Brady is a 28 y.o. G2P1001 at 92w3dwho is admitted for evaluation of preeclampsia, rule out severe features.  Estimated Date of Delivery: 07/14/14.  She was diagnosed with preeclampsia on 05/23/14 with elevated 24 hour urine and started twice weekly testing. She received BMZ injections on 12/8 and completed course on 12/9 at 10:00 am.   Fetal presentation is cephalic.  Length of Stay:  1 Days. Admitted 05/28/2014  Subjective: Denies any headaches, visual changes or RUQ/abdominal pain. Patient reports good fetal movement.  She reports no uterine contractions, no bleeding and no loss of fluid per vagina.  Vitals:  Blood pressure 144/85, pulse 89, temperature 98.4 F (36.9 C), temperature source Oral, resp. rate 18, height _0  (1.575 m), weight 212 lb (96.163 kg), last menstrual period 10/15/2013, SpO2 98 %.  Patient Vitals for the past 24 hrs:  BP Temp Temp src Pulse Resp SpO2 Height Weight  05/28/14 2252 (!) 144/85 mmHg 98.4 F (36.9 C) Oral 89 18 - - -  05/28/14 2026 (!) 141/91 mmHg 97.5 F (36.4 C) Oral 88 18 - - -  05/28/14 1600 137/88 mmHg - - 87 20 - - -  05/28/14 1545 - - - - - - _1  (1.575 m) 212 lb (96.163 kg)  05/28/14 1539 (!) 159/97 mmHg 98.3 F (36.8 C) Oral 84 18 - - -  05/28/14 1242 148/98 mmHg - - 91 - - - -  05/28/14 1227 134/88 mmHg - - 84 - - - -  05/28/14 1212 129/89 mmHg - - 87 - - - -  05/28/14 1203 141/100 mmHg - - 87 - - - -  05/28/14 1133 162/97 mmHg 98 F (36.7 C) - 87 18 98 % - -   Physical Examination: General appearance: alert, cooperative and no distress  Abdomen: gravid, soft, non-tender  Extremities: No edema, DTR's 2+   Fetal monitoring: FHR: 130 bpm, Variability: moderate, Accelerations: Present, Decelerations: Absent  Uterine activity: none  Results for orders placed or performed during the hospital encounter of 05/28/14 (from the past 48 hour(s))  Urinalysis, Routine w reflex  microscopic     Status: Abnormal   Collection Time: 05/28/14 11:35 AM  Result Value Ref Range   Color, Urine YELLOW YELLOW   APPearance CLEAR CLEAR   Specific Gravity, Urine 1.025 1.005 - 1.030   pH 6.5 5.0 - 8.0   Glucose, UA NEGATIVE NEGATIVE mg/dL   Hgb urine dipstick SMALL (A) NEGATIVE   Bilirubin Urine NEGATIVE NEGATIVE   Ketones, ur NEGATIVE NEGATIVE mg/dL   Protein, ur 100 (A) NEGATIVE mg/dL   Urobilinogen, UA 0.2 0.0 - 1.0 mg/dL   Nitrite NEGATIVE NEGATIVE   Leukocytes, UA NEGATIVE NEGATIVE  Protein / creatinine ratio, urine     Status: Abnormal   Collection Time: 05/28/14 11:35 AM  Result Value Ref Range   Creatinine, Urine 77.03 mg/dL   Total Protein, Urine 93.2 mg/dL    Comment: NO NORMAL RANGE ESTABLISHED FOR THIS TEST   Protein Creatinine Ratio 1.21 (H) 0.00 - 0.15  Urine microscopic-add on     Status: Abnormal   Collection Time: 05/28/14 11:35 AM  Result Value Ref Range   Squamous Epithelial / LPF FEW (A) RARE   WBC, UA 0-2 <3 WBC/hpf   RBC / HPF 3-6 <3 RBC/hpf   Bacteria, UA FEW (A) RARE  CBC     Status: Abnormal   Collection Time: 05/28/14 12:15 PM  Result Value  Ref Range   WBC 12.7 (H) 4.0 - 10.5 K/uL   RBC 4.17 3.87 - 5.11 MIL/uL   Hemoglobin 12.4 12.0 - 15.0 g/dL   HCT 37.3 36.0 - 46.0 %   MCV 89.4 78.0 - 100.0 fL   MCH 29.7 26.0 - 34.0 pg   MCHC 33.2 30.0 - 36.0 g/dL   RDW 14.4 11.5 - 15.5 %   Platelets 258 150 - 400 K/uL  Comprehensive metabolic panel     Status: Abnormal   Collection Time: 05/28/14 12:15 PM  Result Value Ref Range   Sodium 139 137 - 147 mEq/L   Potassium 4.2 3.7 - 5.3 mEq/L   Chloride 103 96 - 112 mEq/L   CO2 21 19 - 32 mEq/L   Glucose, Bld 132 (H) 70 - 99 mg/dL   BUN 10 6 - 23 mg/dL   Creatinine, Ser 0.54 0.50 - 1.10 mg/dL   Calcium 9.0 8.4 - 10.5 mg/dL   Total Protein 6.4 6.0 - 8.3 g/dL   Albumin 2.5 (L) 3.5 - 5.2 g/dL   AST 13 0 - 37 U/L   ALT 11 0 - 35 U/L   Alkaline Phosphatase 111 39 - 117 U/L   Total Bilirubin <0.2  (L) 0.3 - 1.2 mg/dL   GFR calc non Af Amer >90 >90 mL/min   GFR calc Af Amer >90 >90 mL/min    Comment: (NOTE) The eGFR has been calculated using the CKD EPI equation. This calculation has not been validated in all clinical situations. eGFR's persistently <90 mL/min signify possible Chronic Kidney Disease.    Anion gap 15 5 - 15    No results found.  Current scheduled medications . docusate sodium  100 mg Oral Daily  . prenatal multivitamin  1 tablet Oral QHS    I have reviewed the patient's current medications.  ASSESSMENT: Patient Active Problem List   Diagnosis Date Noted  . Mild preeclampsia   . Migraine without aura 03/25/2014  . Low grade squamous intraepithelial lesion (LGSIL) on Papanicolaou smear of cervix 02/05/2014  . Supervision of normal intrauterine pregnancy in multigravida in second trimester 01/31/2014  . Clinical depression 11/14/2012  . Anxiety 10/17/2012  . 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   Verita Schneiders, MD, [facility] Attending [place], [facility]

## 2014-05-30 ENCOUNTER — Other Ambulatory Visit: Payer: Managed Care, Other (non HMO)

## 2014-05-30 LAB — PROTEIN, URINE, 24 HOUR
Collection Interval-UPROT: 24 hours
PROTEIN 24H UR: 1050 mg/d — AB (ref <150)
Protein, Urine: 25 mg/dL — ABNORMAL HIGH (ref 5–24)
Urine Total Volume-UPROT: 4200 mL

## 2014-05-30 LAB — CULTURE, BETA STREP (GROUP B ONLY)

## 2014-05-30 MED ORDER — PROMETHAZINE HCL 25 MG PO TABS
25.0000 mg | ORAL_TABLET | Freq: Four times a day (QID) | ORAL | Status: DC | PRN
  Administered 2014-05-30 (×2): 25 mg via ORAL
  Filled 2014-05-30 (×2): qty 1

## 2014-05-30 NOTE — Progress Notes (Signed)
Patient requesting possible discharge. Dr Jolayne Pantheronstant was notified that patient is requesting to see her, but she is in the Operating room.

## 2014-05-30 NOTE — Discharge Instructions (Signed)

## 2014-05-30 NOTE — Progress Notes (Signed)
UR completed 

## 2014-05-30 NOTE — Discharge Summary (Signed)
Physician Discharge Summary  Patient ID: Kara Brady MRN: [id number] DOB/AGE: 29/12/1985 29 y.o.  Admit date: 05/28/2014 Discharge date: 05/30/2014  Admission Diagnoses: Mild preeclampsia  Discharge Diagnoses: same Active Problems:   * No active hospital problems. *   Discharged Condition: good  Hospital Course: 28 yo admitted for evaluation of worsening PIH. Patient was admitted for 2 days during which she completed a course of betamethasone. All of her labs were normal with the exception of a 24 hour urine protein of 1000 mg. Patient remained stable throughout her stay and did not have BP values in the severe range nor any other symptoms. Fetal status remained reassuring. Patient requested to be discharges and agreed to close outpatient follow up with twice weekly testing.  Consults: None  Significant Diagnostic Studies:   Treatments: bedrest and betamethasone  Discharge Exam: Blood pressure 139/84, pulse 85, temperature 98.6 F (37 C), temperature source Oral, resp. rate 18, height 5\' 2"  (1.575 m), weight 212 lb (96.163 kg), last menstrual period 10/15/2013, SpO2 98 %. General appearance: alert, cooperative and no distress Resp: clear to auscultation bilaterally Cardio: regular rate and rhythm GI: soft, gravid, NT Extremities: extremities normal, atraumatic, no cyanosis or edema and 2+ DTR  Disposition: Final discharge disposition not confirmed     Medication List    TAKE these medications        acetaminophen 500 MG tablet  Commonly known as:  TYLENOL  Take 500 mg by mouth every 6 (six) hours as needed for mild pain or headache.     cyclobenzaprine 10 MG tablet  Commonly known as:  FLEXERIL  Take 0.5-1 tablets (5-10 mg total) by mouth 3 (three) times daily as needed for muscle spasms.     PNV PRENATAL PLUS MULTIVITAMIN 27-1 MG Tabs  Take 1 tablet by mouth daily.     promethazine 25 MG tablet  Commonly known as:  PHENERGAN  Take 1 tablet (25 mg total) by mouth  every 6 (six) hours as needed for nausea or vomiting.     SUMAtriptan 100 MG tablet  Commonly known as:  IMITREX  Take 1 tablet (100 mg total) by mouth once as needed for migraine. May repeat in 2 hours if headache persists or recurs.     venlafaxine XR 75 MG 24 hr capsule  Commonly known as:  EFFEXOR XR  Take 1 capsule (75 mg total) by mouth daily with breakfast.       Follow-up Information    [facility] [place].   Specialty:  Obstetrics and Gynecology   Why:  Please call on Monday for appointment. will need twice a week fetal testing   Contact information:   [address], [address] [place] [id number] [phone]      Signed: Erie Sica 05/30/2014, 4:23 PM

## 2014-05-30 NOTE — Progress Notes (Signed)
Patient ID: Kara Brady, female   DOB: 02/06/1986, 28 y.o.   MRN: [id number] FACULTY PRACTICE ANTEPARTUM(COMPREHENSIVE) NOTE  Kara Brady is a 28 y.o. G2P1001 at 3357w4d by best clinical estimate who is admitted for preeclampsia.   Fetal presentation is cephalic. Length of Stay:  2  Days  Subjective: Mild headache and nausea Patient reports the fetal movement as active. Patient reports uterine contraction  activity as none. Patient reports  vaginal bleeding as none. Patient describes fluid per vagina as None.  Vitals:  Blood pressure 159/89, pulse 76, temperature 98.5 F (36.9 C), temperature source Oral, resp. rate 18, height 5\' 2"  (1.575 m), weight 96.163 kg (212 lb), last menstrual period 10/15/2013, SpO2 98 %. Physical Examination:  General appearance - alert, well appearing, and in no distress Heart - normal rate and regular rhythm Abdomen - soft, nontender, nondistended Fundal Height:  size equals dates . Extremities: extremities normal, atraumatic, no cyanosis or edema and Homans sign is negative Membranes:intact  Most Recent FHR        Most Recent Value    Fetal Heart Rate A     Mode  External filed at 05/29/2014 2259    Baseline Rate (A)  145 bpm filed at 05/29/2014 2259    Variability  6-25 BPM filed at 05/29/2014 2259    Accelerations  15 x 15 filed at 05/29/2014 2259    Decelerations  Variable filed at 05/29/2014 2259      Labs:  24 hr urine prot 1050   Medications:  Scheduled . docusate sodium  100 mg Oral Daily  . prenatal multivitamin  1 tablet Oral QHS   I have reviewed the patient's current medications.  ASSESSMENT: Patient Active Problem List   Diagnosis Date Noted  . Mild preeclampsia   . Migraine without aura 03/25/2014  . Low grade squamous intraepithelial lesion (LGSIL) on Papanicolaou smear of cervix 02/05/2014  . Supervision of normal intrauterine pregnancy in multigravida in second trimester 01/31/2014  . Clinical depression  11/14/2012  . Anxiety 10/17/2012  . 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

## 2014-06-02 ENCOUNTER — Other Ambulatory Visit: Payer: Self-pay | Admitting: *Deleted

## 2014-06-02 ENCOUNTER — Ambulatory Visit (INDEPENDENT_AMBULATORY_CARE_PROVIDER_SITE_OTHER): Payer: Managed Care, Other (non HMO) | Admitting: Obstetrics & Gynecology

## 2014-06-02 ENCOUNTER — Encounter (HOSPITAL_COMMUNITY): Payer: Self-pay | Admitting: *Deleted

## 2014-06-02 ENCOUNTER — Inpatient Hospital Stay (HOSPITAL_COMMUNITY)
Admission: AD | Admit: 2014-06-02 | Discharge: 2014-06-06 | DRG: 774 | Disposition: A | Discharge status: Home / Self Care | Payer: Managed Care, Other (non HMO) | Source: Ambulatory Visit | Attending: Family Medicine | Admitting: Family Medicine

## 2014-06-02 VITALS — BP 166/91 | HR 80 | Wt 214.0 lb

## 2014-06-02 DIAGNOSIS — O1092 Unspecified pre-existing hypertension complicating childbirth: Secondary | ICD-10-CM | POA: Diagnosis present

## 2014-06-02 DIAGNOSIS — Z3483 Encounter for supervision of other normal pregnancy, third trimester: Secondary | ICD-10-CM | POA: Diagnosis present

## 2014-06-02 DIAGNOSIS — G43019 Migraine without aura, intractable, without status migrainosus: Secondary | ICD-10-CM

## 2014-06-02 DIAGNOSIS — Z3A34 34 weeks gestation of pregnancy: Secondary | ICD-10-CM | POA: Diagnosis present

## 2014-06-02 DIAGNOSIS — O1413 Severe pre-eclampsia, third trimester: Secondary | ICD-10-CM | POA: Diagnosis present

## 2014-06-02 DIAGNOSIS — O1490 Unspecified pre-eclampsia, unspecified trimester: Secondary | ICD-10-CM | POA: Diagnosis present

## 2014-06-02 DIAGNOSIS — O1402 Mild to moderate pre-eclampsia, second trimester: Secondary | ICD-10-CM

## 2014-06-02 DIAGNOSIS — Z3482 Encounter for supervision of other normal pregnancy, second trimester: Secondary | ICD-10-CM

## 2014-06-02 LAB — CBC
HCT: 36.4 % (ref 36.0–46.0)
Hemoglobin: 12.1 g/dL (ref 12.0–15.0)
MCH: 29.5 pg (ref 26.0–34.0)
MCHC: 33.2 g/dL (ref 30.0–36.0)
MCV: 88.8 fL (ref 78.0–100.0)
Platelets: 229 K/uL (ref 150–400)
RBC: 4.1 MIL/uL (ref 3.87–5.11)
RDW: 14.6 % (ref 11.5–15.5)
WBC: 10.5 K/uL (ref 4.0–10.5)

## 2014-06-02 LAB — URINALYSIS, ROUTINE W REFLEX MICROSCOPIC
Bilirubin Urine: NEGATIVE
Glucose, UA: NEGATIVE mg/dL
Ketones, ur: NEGATIVE mg/dL
Leukocytes, UA: NEGATIVE
Nitrite: NEGATIVE
Protein, ur: 100 mg/dL — AB
Specific Gravity, Urine: 1.02 (ref 1.005–1.030)
Urobilinogen, UA: 0.2 mg/dL (ref 0.0–1.0)
pH: 6.5 (ref 5.0–8.0)

## 2014-06-02 LAB — COMPREHENSIVE METABOLIC PANEL WITH GFR
ALT: 9 U/L (ref 0–35)
AST: 11 U/L (ref 0–37)
Albumin: 2.3 g/dL — ABNORMAL LOW (ref 3.5–5.2)
Alkaline Phosphatase: 126 U/L — ABNORMAL HIGH (ref 39–117)
Anion gap: 13 (ref 5–15)
BUN: 15 mg/dL (ref 6–23)
CO2: 22 meq/L (ref 19–32)
Calcium: 8.8 mg/dL (ref 8.4–10.5)
Chloride: 106 meq/L (ref 96–112)
Creatinine, Ser: 0.66 mg/dL (ref 0.50–1.10)
GFR calc Af Amer: >90 mL/min
GFR calc non Af Amer: >90 mL/min
Glucose, Bld: 122 mg/dL — ABNORMAL HIGH (ref 70–99)
Potassium: 4.4 meq/L (ref 3.7–5.3)
Sodium: 141 meq/L (ref 137–147)
Total Bilirubin: <0.2 mg/dL — ABNORMAL LOW (ref 0.3–1.2)
Total Protein: 6 g/dL (ref 6.0–8.3)

## 2014-06-02 LAB — URINE MICROSCOPIC-ADD ON

## 2014-06-02 LAB — PROTEIN / CREATININE RATIO, URINE
Creatinine, Urine: 73.93 mg/dL
PROTEIN CREATININE RATIO: 1.04 — AB (ref 0.00–0.15)
Total Protein, Urine: 76.9 mg/dL

## 2014-06-02 MED ORDER — PENICILLIN G POTASSIUM 5000000 UNITS IJ SOLR
2.5000 10*6.[IU] | INTRAVENOUS | Status: DC
  Filled 2014-06-02 (×4): qty 2.5

## 2014-06-02 MED ORDER — OXYTOCIN 40 UNITS IN LACTATED RINGERS INFUSION - SIMPLE MED: 62.5000 mL/h | INTRAVENOUS | Status: DC

## 2014-06-02 MED ORDER — ONDANSETRON HCL 4 MG/2ML IJ SOLN: 4.0000 mg | Freq: Four times a day (QID) | INTRAMUSCULAR | Status: DC | PRN

## 2014-06-02 MED ORDER — LIDOCAINE HCL (PF) 1 % IJ SOLN
30.0000 mL | INTRAMUSCULAR | Status: DC | PRN
  Filled 2014-06-02: qty 30

## 2014-06-02 MED ORDER — FLEET ENEMA 7-19 GM/118ML RE ENEM: 1.0000 | ENEMA | RECTAL | Status: DC | PRN

## 2014-06-02 MED ORDER — MAGNESIUM SULFATE BOLUS VIA INFUSION
4.0000 g | Freq: Once | INTRAVENOUS | Status: AC
  Administered 2014-06-02: 4 g via INTRAVENOUS
  Filled 2014-06-02: qty 500

## 2014-06-02 MED ORDER — BUTALBITAL-APAP-CAFFEINE 50-325-40 MG PO TABS
1.0000 | ORAL_TABLET | Freq: Four times a day (QID) | ORAL | Status: DC | PRN
  Administered 2014-06-02 – 2014-06-03 (×4): 1 via ORAL
  Filled 2014-06-02 (×4): qty 1

## 2014-06-02 MED ORDER — OXYCODONE-ACETAMINOPHEN 5-325 MG PO TABS: 2.0000 | ORAL_TABLET | ORAL | Status: DC | PRN

## 2014-06-02 MED ORDER — PROMETHAZINE HCL 25 MG PO TABS: 25.0000 mg | ORAL_TABLET | Freq: Four times a day (QID) | ORAL | Status: DC | PRN

## 2014-06-02 MED ORDER — LABETALOL HCL 5 MG/ML IV SOLN
20.0000 mg | INTRAVENOUS | Status: DC | PRN
  Filled 2014-06-02: qty 4

## 2014-06-02 MED ORDER — MAGNESIUM SULFATE 40 G IN LACTATED RINGERS - SIMPLE
2.0000 g/h | INTRAVENOUS | Status: DC
  Administered 2014-06-03: 2 g/h via INTRAVENOUS
  Filled 2014-06-02 (×2): qty 500

## 2014-06-02 MED ORDER — NALBUPHINE HCL 10 MG/ML IJ SOLN
10.0000 mg | Freq: Once | INTRAMUSCULAR | Status: DC
  Filled 2014-06-02: qty 1

## 2014-06-02 MED ORDER — CITRIC ACID-SODIUM CITRATE 334-500 MG/5ML PO SOLN: 30.0000 mL | ORAL | Status: DC | PRN

## 2014-06-02 MED ORDER — PROMETHAZINE HCL 25 MG/ML IJ SOLN
12.5000 mg | Freq: Four times a day (QID) | INTRAMUSCULAR | Status: DC | PRN
  Filled 2014-06-02: qty 1

## 2014-06-02 MED ORDER — OXYCODONE-ACETAMINOPHEN 5-325 MG PO TABS: 1.0000 | ORAL_TABLET | ORAL | Status: DC | PRN

## 2014-06-02 MED ORDER — OXYTOCIN BOLUS FROM INFUSION
500.0000 mL | INTRAVENOUS | Status: DC
  Administered 2014-06-04: 500 mL via INTRAVENOUS

## 2014-06-02 MED ORDER — ACETAMINOPHEN 325 MG PO TABS
650.0000 mg | ORAL_TABLET | ORAL | Status: DC | PRN
Start: 2014-06-02 — End: 2014-06-04

## 2014-06-02 MED ORDER — LACTATED RINGERS IV SOLN
INTRAVENOUS | Status: DC
  Administered 2014-06-02 – 2014-06-03 (×4): via INTRAVENOUS
  Administered 2014-06-04: 125 mL/h via INTRAVENOUS
  Administered 2014-06-04: 04:00:00 via INTRAVENOUS

## 2014-06-02 MED ORDER — LACTATED RINGERS IV SOLN: 500.0000 mL | INTRAVENOUS | Status: DC | PRN

## 2014-06-02 MED ORDER — PENICILLIN G POTASSIUM 5000000 UNITS IJ SOLR
5.0000 10*6.[IU] | Freq: Once | INTRAVENOUS | Status: AC
  Administered 2014-06-03: 5 10*6.[IU] via INTRAVENOUS
  Filled 2014-06-02: qty 5

## 2014-06-02 NOTE — Telephone Encounter (Signed)
RF authorization for Phenergan 25 mg tab sent to [facility].

## 2014-06-02 NOTE — MAU Note (Signed)
Patient sent from office at [redacted] weeks gestation complaining of headache and for preeclampsia evaluation. Denies bleeding, LOF or abdominal pain. Fetus active.

## 2014-06-02 NOTE — Progress Notes (Signed)
[facility] She continues to have headaches and has one now. I have recommended that she go back to the MAU.

## 2014-06-02 NOTE — MAU Note (Signed)
Pt reports headaches since this morning. Also had elevated blood pressures at home

## 2014-06-02 NOTE — H&P (Cosign Needed)
Kara Brady is a 28 y.o. female G2P1001 at [redacted]w[redacted]d by 21wk [place] presenting for severe pre-eclampsia.    Patient was recently admitted to antenatal for pre-eclampsia on 12/9, discharged on 12/11 with plan for twice weekly testing.  She was seen in clinic today, BP elevated to 166/91 and complaining of HA. No visual changes, RUQ pain, or LE edema.  BP elevated to 177/98 in MAU.  She was diagnosed with preeclampsia on 05/23/14 with elevated 24 hour urine and started twice weekly testing. She received BMZ injections on 12/8 and completed course on 12/9 at 10:00 am.   +FM, denies LOF, VB, contractions, vaginal discharge.   Clinic  [facility]  Dating By 21 week U/S on 03/03/14  Genetic Screen declined  Anatomic [place] nl  Glucose 1 hour-180; 3 hr--all nml  TDaP vaccine 04/22/14  Flu vaccine   GBS   Contraception [place]?  Baby Food breast  Circumcision IP  Pediatrician   Support Person      Maternal Medical History:  Reason for admission: Nausea.     OB History    Gravida Para Term Preterm AB TAB SAB Ectopic Multiple Living   2 1 1   0     1     Past Medical History  Diagnosis Date  . History of migraine headaches   . Hypertension   . Headache    Past Surgical History  Procedure Laterality Date  . Breast biopsy     Family History: family history includes Diabetes in her paternal grandfather; Hypertension in her mother. Social History:  reports that she has never smoked. She does not have any smokeless tobacco history on file. She reports that she does not drink alcohol or use illicit drugs.   Review of Systems  Constitutional: Negative for fever and chills.  Eyes: Negative for blurred vision.  Respiratory: Negative for cough and shortness of breath.   Cardiovascular: Negative for chest pain and leg swelling.  Gastrointestinal: Positive for nausea. Negative for heartburn, abdominal pain, diarrhea and constipation.  Genitourinary: Negative for dysuria, urgency and frequency.  Skin:  Negative for itching.  Neurological: Positive for headaches.      Blood pressure 153/86, pulse 75, temperature 98.4 F (36.9 C), temperature source Oral, resp. rate 18, height 5\' 2"  (1.575 m), weight 97.523 kg (215 lb), last menstrual period 10/15/2013, SpO2 100 %. Maternal Exam:  Abdomen: Fetal presentation: vertex  Introitus: Normal vulva. Normal vagina.  Ferning test: not done.  Nitrazine test: not done.  Pelvis: adequate for delivery.   Cervix: Cervix evaluated by digital exam.     Fetal Exam Fetal Monitor Review: Baseline rate: 140.  Variability: moderate (6-25 bpm).   Pattern: accelerations present and no decelerations.    Fetal State Assessment: Category I - tracings are normal.     Physical Exam  Constitutional: She is oriented to person, place, and time. She appears well-developed and well-nourished. No distress.  HENT:  Head: Normocephalic and atraumatic.  Cardiovascular: Normal rate and intact distal pulses.   Respiratory: Effort normal. No respiratory distress.  GI: There is no tenderness.  Gravid  Genitourinary: Vagina normal.  Cervix: 2/50/posterior Product/process development scientist)  Musculoskeletal: She exhibits no edema or tenderness.  Neurological: She is alert and oriented to person, place, and time.  Skin: Skin is warm and dry.    Prenatal labs: ABO, Rh: O/POS/-- (08/14 1126) Antibody: NEG (08/14 1126) Rubella: 0.88 (08/14 1126) RPR: NON REAC (11/03 1501)  HBsAg: NEGATIVE (08/14 1126)  HIV: NONREACTIVE (11/03 1501)  GBS:   pending  Results for orders placed or performed during the hospital encounter of 06/02/14 (from the past 24 hour(s))  Protein / creatinine ratio, urine     Status: Abnormal   Collection Time: 06/02/14  5:36 PM  Result Value Ref Range   Creatinine, Urine 73.93 mg/dL   Total Protein, Urine 76.9 mg/dL   Protein Creatinine Ratio 1.04 (H) 0.00 - 0.15  Urinalysis, Routine w reflex microscopic     Status: Abnormal   Collection Time: 06/02/14  7:36 PM   Result Value Ref Range   Color, Urine YELLOW YELLOW   APPearance CLEAR CLEAR   Specific Gravity, Urine 1.020 1.005 - 1.030   pH 6.5 5.0 - 8.0   Glucose, UA NEGATIVE NEGATIVE mg/dL   Hgb urine dipstick SMALL (A) NEGATIVE   Bilirubin Urine NEGATIVE NEGATIVE   Ketones, ur NEGATIVE NEGATIVE mg/dL   Protein, ur 235100 (A) NEGATIVE mg/dL   Urobilinogen, UA 0.2 0.0 - 1.0 mg/dL   Nitrite NEGATIVE NEGATIVE   Leukocytes, UA NEGATIVE NEGATIVE  Urine microscopic-add on     Status: Abnormal   Collection Time: 06/02/14  7:36 PM  Result Value Ref Range   Squamous Epithelial / LPF FEW (A) RARE   WBC, UA 0-2 <3 WBC/hpf   RBC / HPF 3-6 <3 RBC/hpf   Bacteria, UA FEW (A) RARE   Urine-Other MUCOUS PRESENT   CBC     Status: None   Collection Time: 06/02/14  8:32 PM  Result Value Ref Range   WBC 10.5 4.0 - 10.5 K/uL   RBC 4.10 3.87 - 5.11 MIL/uL   Hemoglobin 12.1 12.0 - 15.0 g/dL   HCT 57.336.4 22.036.0 - 25.446.0 %   MCV 88.8 78.0 - 100.0 fL   MCH 29.5 26.0 - 34.0 pg   MCHC 33.2 30.0 - 36.0 g/dL   RDW 27.014.6 62.311.5 - 76.215.5 %   Platelets 229 150 - 400 K/uL  Comprehensive metabolic panel     Status: Abnormal   Collection Time: 06/02/14  8:32 PM  Result Value Ref Range   Sodium 141 137 - 147 mEq/L   Potassium 4.4 3.7 - 5.3 mEq/L   Chloride 106 96 - 112 mEq/L   CO2 22 19 - 32 mEq/L   Glucose, Bld 122 (H) 70 - 99 mg/dL   BUN 15 6 - 23 mg/dL   Creatinine, Ser 8.310.66 0.50 - 1.10 mg/dL   Calcium 8.8 8.4 - 51.710.5 mg/dL   Total Protein 6.0 6.0 - 8.3 g/dL   Albumin 2.3 (L) 3.5 - 5.2 g/dL   AST 11 0 - 37 U/L   ALT 9 0 - 35 U/L   Alkaline Phosphatase 126 (H) 39 - 117 U/L   Total Bilirubin <0.2 (L) 0.3 - 1.2 mg/dL   GFR calc non Af Amer >90 >90 mL/min   GFR calc Af Amer >90 >90 mL/min   Anion gap 13 5 - 15    Assessment/Plan: Skeet Simmerna G Boeke is a 28 y.o. G2P1001 at 5123w0d here for IOL for severe pre-eclampsia.  #Labor: IOL with FB placement and 1x1 pitocin # Severe pre-eclampsia: Magnesium #Pain: Epidural upon request  when in active labor #FWB: Cat 1 #ID:  GBS pending, will treat prophylactically until results available #MOF: breast #MOC: minipills? #Circ:  Desires, IP   Shirlee LatchBacigalupo, Angela 06/02/2014, 10:41 PM    OB fellow attestation:  I have seen and examined this patient; I agree with above documentation in the resident's note.   Kirby CriglerAna G  Shon HaleLeon is a 28 y.o. G2P1001 sent from clinic for evaluation of headache and elevated blood pressures.  +FM, denies LOF, VB, contractions, vaginal discharge.  PE: BP 122/69 mmHg  Pulse 75  Temp(Src) 97.5 F (36.4 C) (Oral)  Resp 18  Ht 5\' 2"  (1.575 m)  Wt 214 lb (97.07 kg)  BMI 39.13 kg/m2  SpO2 100%  LMP 10/15/2013  BPs 140s-170s/80s-90s Gen: calm comfortable, NAD Resp: normal effort, no distress Abd: gravid  ROS, labs, PMH reviewed NST reactive   Plan: # Preeclampsia with severe features Severe by headache and blood pressures. - Admit for induction of labor.  - Foley bulb with pitocin to max 6 while bulb in place. Plan to increase pitocin 2x2 once foley bulb out.  - Cycle blood pressures.  - Start magnesium sulfate.  - Labetalol prn for BP > 160/105   William DaltonMcEachern, Lavonn Maxcy, MD 5:24 AM

## 2014-06-03 LAB — CBC
HCT: 38.4 % (ref 36.0–46.0)
Hemoglobin: 12.8 g/dL (ref 12.0–15.0)
MCH: 29.6 pg (ref 26.0–34.0)
MCHC: 33.3 g/dL (ref 30.0–36.0)
MCV: 88.7 fL (ref 78.0–100.0)
Platelets: 228 K/uL (ref 150–400)
RBC: 4.33 MIL/uL (ref 3.87–5.11)
RDW: 14.9 % (ref 11.5–15.5)
WBC: 12.4 K/uL — ABNORMAL HIGH (ref 4.0–10.5)

## 2014-06-03 LAB — COMPREHENSIVE METABOLIC PANEL
ALT: 10 U/L (ref 0–35)
AST: 13 U/L (ref 0–37)
Albumin: 2.4 g/dL — ABNORMAL LOW (ref 3.5–5.2)
Alkaline Phosphatase: 125 U/L — ABNORMAL HIGH (ref 39–117)
Anion gap: 12 (ref 5–15)
BUN: 7 mg/dL (ref 6–23)
CALCIUM: 7.7 mg/dL — AB (ref 8.4–10.5)
CO2: 23 meq/L (ref 19–32)
CREATININE: 0.53 mg/dL (ref 0.50–1.10)
Chloride: 98 mEq/L (ref 96–112)
GFR calc Af Amer: >90 mL/min (ref >90)
GLUCOSE: 121 mg/dL — AB (ref 70–99)
Potassium: 4.1 mEq/L (ref 3.7–5.3)
Sodium: 133 mEq/L — ABNORMAL LOW (ref 137–147)
Total Bilirubin: <0.2 mg/dL — ABNORMAL LOW (ref 0.3–1.2)
Total Protein: 6.3 g/dL (ref 6.0–8.3)

## 2014-06-03 LAB — RPR

## 2014-06-03 LAB — ABO/RH: ABO/RH(D): O POS

## 2014-06-03 LAB — TYPE AND SCREEN
ABO/RH(D): O POS
Antibody Screen: NEGATIVE

## 2014-06-03 LAB — HIV ANTIBODY (ROUTINE TESTING W REFLEX): HIV 1&2 Ab, 4th Generation: NONREACTIVE

## 2014-06-03 LAB — GROUP B STREP BY PCR: Group B strep by PCR: NEGATIVE

## 2014-06-03 LAB — OB RESULTS CONSOLE GBS: GBS: NEGATIVE

## 2014-06-03 MED ORDER — PHENYLEPHRINE 40 MCG/ML (10ML) SYRINGE FOR IV PUSH (FOR BLOOD PRESSURE SUPPORT)
80.0000 ug | PREFILLED_SYRINGE | INTRAVENOUS | Status: DC | PRN
  Filled 2014-06-03: qty 2

## 2014-06-03 MED ORDER — NALBUPHINE HCL 10 MG/ML IJ SOLN
10.0000 mg | Freq: Once | INTRAMUSCULAR | Status: AC
  Administered 2014-06-02: 10 mg via INTRAVENOUS

## 2014-06-03 MED ORDER — OXYTOCIN 40 UNITS IN LACTATED RINGERS INFUSION - SIMPLE MED
1.0000 m[IU]/min | INTRAVENOUS | Status: DC
  Administered 2014-06-03: 1 m[IU]/min via INTRAVENOUS
  Filled 2014-06-03: qty 1000

## 2014-06-03 MED ORDER — LABETALOL HCL 5 MG/ML IV SOLN
20.0000 mg | INTRAVENOUS | Status: AC | PRN
  Administered 2014-06-04 (×3): 20 mg via INTRAVENOUS
  Filled 2014-06-03 (×4): qty 4

## 2014-06-03 MED ORDER — FENTANYL 2.5 MCG/ML BUPIVACAINE 1/10 % EPIDURAL INFUSION (WH - ANES)
14.0000 mL/h | INTRAMUSCULAR | Status: DC | PRN
  Filled 2014-06-03: qty 125

## 2014-06-03 MED ORDER — MISOPROSTOL 50MCG HALF TABLET
50.0000 ug | ORAL_TABLET | Freq: Once | ORAL | Status: AC
  Administered 2014-06-03: 50 ug via ORAL
  Filled 2014-06-03: qty 1

## 2014-06-03 MED ORDER — PHENYLEPHRINE 40 MCG/ML (10ML) SYRINGE FOR IV PUSH (FOR BLOOD PRESSURE SUPPORT)
80.0000 ug | PREFILLED_SYRINGE | INTRAVENOUS | Status: DC | PRN
  Filled 2014-06-03: qty 2
  Filled 2014-06-03: qty 10

## 2014-06-03 MED ORDER — HYDRALAZINE HCL 20 MG/ML IJ SOLN
10.0000 mg | Freq: Once | INTRAMUSCULAR | Status: DC
  Filled 2014-06-03: qty 1

## 2014-06-03 MED ORDER — EPHEDRINE 5 MG/ML INJ
10.0000 mg | INTRAVENOUS | Status: DC | PRN
  Filled 2014-06-03: qty 2

## 2014-06-03 MED ORDER — MORPHINE SULFATE 4 MG/ML IJ SOLN
4.0000 mg | Freq: Once | INTRAMUSCULAR | Status: AC
  Administered 2014-06-03: 4 mg via INTRAVENOUS
  Filled 2014-06-03: qty 1

## 2014-06-03 MED ORDER — HYDRALAZINE HCL 20 MG/ML IJ SOLN
10.0000 mg | Freq: Once | INTRAMUSCULAR | Status: AC
  Administered 2014-06-03: 10 mg via INTRAVENOUS
  Filled 2014-06-03: qty 1

## 2014-06-03 MED ORDER — TERBUTALINE SULFATE 1 MG/ML IJ SOLN: 0.2500 mg | Freq: Once | INTRAMUSCULAR | Status: AC | PRN

## 2014-06-03 MED ORDER — LACTATED RINGERS IV SOLN: 500.0000 mL | Freq: Once | INTRAVENOUS | Status: DC

## 2014-06-03 MED ORDER — DIPHENHYDRAMINE HCL 50 MG/ML IJ SOLN: 12.5000 mg | INTRAMUSCULAR | Status: DC | PRN

## 2014-06-03 MED ORDER — LABETALOL HCL 5 MG/ML IV SOLN
20.0000 mg | INTRAVENOUS | Status: AC | PRN
Start: 2014-06-03 — End: 2014-06-03
  Administered 2014-06-03 (×3): 20 mg via INTRAVENOUS
  Filled 2014-06-03 (×2): qty 4

## 2014-06-03 MED ORDER — OXYTOCIN 40 UNITS IN LACTATED RINGERS INFUSION - SIMPLE MED
1.0000 m[IU]/min | INTRAVENOUS | Status: DC
  Administered 2014-06-03: 2 m[IU]/min via INTRAVENOUS
  Filled 2014-06-03: qty 1000

## 2014-06-03 NOTE — Progress Notes (Signed)
LABOR PROGRESS NOTE  Kara Brady is a 28 y.o. G2P1001 at 4160w1d  admitted for induction of labor due to preeclampsia with severe features.  Subjective: Has headache, has improved from 8/10 to 6/10 with IV morphine.  Objective: BP 148/96 mmHg  Pulse 75  Temp(Src) 97.5 F (36.4 C) (Oral)  Resp 18  Ht 5\' 2"  (1.575 m)  Wt 214 lb (97.07 kg)  BMI 39.13 kg/m2  SpO2 99%  LMP 10/15/2013 or  Filed Vitals:   06/03/14 0903 06/03/14 0908 06/03/14 0913 06/03/14 0917  BP:    148/96  Pulse: 78 78 74 75  Temp:      TempSrc:      Resp:    18  Height:      Weight:      SpO2: 99% 99% 99%     Total I/O In: 250.8 [I.V.:250.8] Out: 700 [Urine:700]  FHT:  FHR: 120 bpm, variability: moderate,  accelerations:  Present,  decelerations:  Absent UC:   q2-735min  SVE:   Dilation: 4.5 Effacement (%): 60 Station: -2 Exam by:: Woody Sellerhelsea Dean, RN  Dilation: 4.5 Effacement (%): 60 Station: -2 Presentation: Vertex Exam by:: Woody Sellerhelsea Dean, RN  Pitocin @ 3 mu/min  Labs: Lab Results  Component Value Date   WBC 10.5 06/02/2014   HGB 12.1 06/02/2014   HCT 36.4 06/02/2014   MCV 88.8 06/02/2014   PLT 229 06/02/2014    Assessment / Plan: Induction of labor due to preeclampsia with severe features,  progressing well on pitocin  Labor: Progressing normally Fetal Wellbeing:  Category I Pain Control:  Epidural Anticipated MOD:  NSVD Preeclampisa: blood pressures elevated, s/p 3 doses labetalol IV, 1 dose hydralazine IV and now with BP 159/101.  Will give hydralazine 10mg  IV and 10mg  IM simultaneously. - currently on mag  HA: s/p 4mg  morphine IV  Bianca Vester ROCIO, MD 06/03/2014, 9:36 AM

## 2014-06-03 NOTE — Progress Notes (Signed)
LABOR PROGRESS NOTE  Kara Brady is a 28 y.o. G2P1001 at 5372w1d  admitted for induction of labor due to preeclampsia with severe features.  Subjective: Still has headache, s/p 2nd dose morphine 4mg  IV  Objective: BP 151/88 mmHg  Pulse 79  Temp(Src) 97.9 F (36.6 C) (Oral)  Resp 18  Ht 5\' 2"  (1.575 m)  Wt 214 lb (97.07 kg)  BMI 39.13 kg/m2  SpO2 99%  LMP 10/15/2013 or  Filed Vitals:   06/03/14 1132 06/03/14 1202 06/03/14 1232 06/03/14 1233  BP: 138/75 167/101 156/86 151/88  Pulse: 75 78 86 79  Temp:      TempSrc:      Resp: 18 18 18    Height:      Weight:      SpO2:        Total I/O In: 504.1 [I.V.:504.1] Out: 1450 [Urine:1450]  FHT:  FHR: 120 bpm, variability: moderate,  accelerations:  Present,  decelerations:  Absent UC:   q2-745min   Dilation: 4 Effacement (%): 70 Station: Ballard[place], -2 Presentation: Vertex Exam by:: Dr Janee Mornhompson, Dr Loreta AveAcosta  Pitocin @ 17 mu/min  Labs: Lab Results  Component Value Date   WBC 10.5 06/02/2014   HGB 12.1 06/02/2014   HCT 36.4 06/02/2014   MCV 88.8 06/02/2014   PLT 229 06/02/2014    Assessment / Plan: Induction of labor due to preeclampsia with severe features,  progressing well on pitocin  Labor: Progressing normally, will give pitocin break, pt may eat and then restart pit at 328mu/hr Fetal Wellbeing:  Category I Pain Control:  Epidural Anticipated MOD:  NSVD Preeclampisa: blood pressures elevated, s/p 3 doses labetalol IV, 1 dose hydralazine IV, did not receive last dose of hydralazine as repeat blood pressures were within normal limits  HA: s/p 4mg  morphine IV  0930: 4-5/60/-2  1200 4/70/-2 by me => pit break  Perry MountACOSTA,Macey Wurtz ROCIO, MD 06/03/2014, 12:40 PM

## 2014-06-03 NOTE — Progress Notes (Signed)
LABOR PROGRESS NOTE  Kara Brady is a 28 y.o. G2P1001 at 5330w1d  admitted for induction of labor due to preeclampsia with severe features.  Subjective: HA improving, feeling contractions more strongly  Objective: BP 153/87 mmHg  Pulse 80  Temp(Src) 97.9 F (36.6 C) (Oral)  Resp 20  Ht 5\' 2"  (1.575 m)  Wt 97.07 kg (214 lb)  BMI 39.13 kg/m2  SpO2 99%  LMP 10/15/2013 or  Filed Vitals:   06/03/14 2102 06/03/14 2132 06/03/14 2202 06/03/14 2232  BP: 138/84 146/85 153/95 153/87  Pulse: 85 82 79 80  Temp:      TempSrc:      Resp: 20 20 20 20   Height:      Weight:      SpO2:        Total I/O In: 725 [P.O.:350; I.V.:375] Out: 875 [Urine:875]  FHT:  FHR: 120 bpm, variability: moderate,  accelerations:  Present,  decelerations:  Absent UC:   q7-10 minutes   Dilation: 5 Effacement (%): 60 Station:  -2 Presentation: Vertex Exam by:: Dr Beryle FlockBacigalupo   Labs: Lab Results  Component Value Date   WBC 12.4* 06/03/2014   HGB 12.8 06/03/2014   HCT 38.4 06/03/2014   MCV 88.7 06/03/2014   PLT 228 06/03/2014    Assessment / Plan: Induction of labor due to preeclampsia with severe features,  progressing well on pitocin  Labor: Progressing normally, s/p pitocin break with dose of PO cytotec.  Will plan to restart pitocin at 11pm. Fetal Wellbeing:  Category I Pain Control:  Epidural available if desired, nothing currently Anticipated MOD:  NSVD Preeclampisa: blood pressures elevated, s/p 3 doses labetalol IV, 1 dose hydralazine IV   Shirlee LatchBacigalupo, Angela, MD 06/03/2014, 10:49 PM

## 2014-06-03 NOTE — Consult Note (Signed)
Neonatology Consult  Note:  At the request of the patients obstetrician Dr. Nehemiah Settle I met with Katherina Mires who is at 40 1 weeks currently with pregnancy complicated by  Pre-eclampsia.  She is currently being treated with magnesium sulfate and received betamethasone 12/8-9.  She is due to start IOL today.    We reviewed initial delivery room management, including CPAP, [place], and low but certainly possible need for intubation for surfactant administration.  We discussed feeding immaturity and need for full po intake with multiple days of good weight gain and no apnea or bradycardia before discharge.  We reviewed increased risk of jaundice, infection, and temperature instability.   Discussed likely length of stay.  Thank you for allowing [place] to participate in her care.  Please call with questions.  Higinio Roger, DO  Neonatologist   The total length of face-to-face or floor / unit time for this encounter was 20 minutes.  Counseling and / or coordination of care was greater than fifty percent of the time.

## 2014-06-03 NOTE — Progress Notes (Signed)
Kara Brady is a 28 y.o. G2P1001 at 2078w1d by ultrasound admitted for induction of labor due to Pre-eclamptic toxemia of pregnancy..  Subjective: Resting comfortably.  Objective: BP 122/69 mmHg  Pulse 75  Temp(Src) 97.5 F (36.4 C) (Oral)  Resp 18  Ht 5\' 2"  (1.575 m)  Wt 97.07 kg (214 lb)  BMI 39.13 kg/m2  SpO2 100%  LMP 10/15/2013   Total I/O In: 1222.6 [P.O.:150; I.V.:822.6; IV Piggyback:250] Out: 1050 [Urine:1050]  FHT:  FHR: 130 bpm, variability: moderate,  accelerations:  Present,  decelerations:  Absent UC:   irregular SVE:   Dilation: 4.5 Effacement (%): 60 Station: -2 Exam by:: Kara Sellerhelsea Dean, RN  Labs: Lab Results  Component Value Date   WBC 10.5 06/02/2014   HGB 12.1 06/02/2014   HCT 36.4 06/02/2014   MCV 88.8 06/02/2014   PLT 229 06/02/2014    Assessment / Plan: Induction of labor due to preeclampsia,  progressing well on pitocin  Labor: Progressing on Pitocin, will continue to increase then AROM, s/p FB @ 0404 Preeclampsia:  on magnesium sulfate, no signs or symptoms of toxicity, intake and ouput balanced and labs stable Fetal Wellbeing:  Category I Pain Control:  Labor support without medications and may have epidural if desired I/D:  GBS negative, ppx PCN turned off Anticipated MOD:  NSVD  Shirlee LatchBacigalupo, Leida Luton 06/03/2014, 5:18 AM

## 2014-06-03 NOTE — Progress Notes (Signed)
Dr Eric FormWimmer notified of consult order and given information on patient, gestational age, and reason for induction.

## 2014-06-04 ENCOUNTER — Encounter (HOSPITAL_COMMUNITY): Payer: Self-pay | Admitting: *Deleted

## 2014-06-04 DIAGNOSIS — Z3A34 34 weeks gestation of pregnancy: Secondary | ICD-10-CM

## 2014-06-04 DIAGNOSIS — O1413 Severe pre-eclampsia, third trimester: Secondary | ICD-10-CM

## 2014-06-04 LAB — CBC
HCT: 37.9 % (ref 36.0–46.0)
Hemoglobin: 12.5 g/dL (ref 12.0–15.0)
MCH: 29.1 pg (ref 26.0–34.0)
MCHC: 33 g/dL (ref 30.0–36.0)
MCV: 88.1 fL (ref 78.0–100.0)
PLATELETS: 250 10*3/uL (ref 150–400)
RBC: 4.3 MIL/uL (ref 3.87–5.11)
RDW: 15.1 % (ref 11.5–15.5)
WBC: 13 10*3/uL — AB (ref 4.0–10.5)

## 2014-06-04 LAB — MRSA PCR SCREENING: MRSA BY PCR: NEGATIVE

## 2014-06-04 MED ORDER — SENNOSIDES-DOCUSATE SODIUM 8.6-50 MG PO TABS
2.0000 | ORAL_TABLET | ORAL | Status: DC
  Administered 2014-06-04 – 2014-06-05 (×2): 2 via ORAL
  Filled 2014-06-04 (×2): qty 2

## 2014-06-04 MED ORDER — BENZOCAINE-MENTHOL 20-0.5 % EX AERO: 1.0000 "application " | INHALATION_SPRAY | CUTANEOUS | Status: DC | PRN

## 2014-06-04 MED ORDER — TETANUS-DIPHTH-ACELL PERTUSSIS 5-2.5-18.5 LF-MCG/0.5 IM SUSP
0.5000 mL | Freq: Once | INTRAMUSCULAR | Status: DC
  Filled 2014-06-04: qty 0.5

## 2014-06-04 MED ORDER — ZOLPIDEM TARTRATE 5 MG PO TABS: 5.0000 mg | ORAL_TABLET | Freq: Every evening | ORAL | Status: DC | PRN

## 2014-06-04 MED ORDER — LANOLIN HYDROUS EX OINT: TOPICAL_OINTMENT | CUTANEOUS | Status: DC | PRN

## 2014-06-04 MED ORDER — DIPHENHYDRAMINE HCL 25 MG PO CAPS: 25.0000 mg | ORAL_CAPSULE | Freq: Four times a day (QID) | ORAL | Status: DC | PRN

## 2014-06-04 MED ORDER — IBUPROFEN 600 MG PO TABS
600.0000 mg | ORAL_TABLET | Freq: Four times a day (QID) | ORAL | Status: DC
  Administered 2014-06-04 – 2014-06-06 (×9): 600 mg via ORAL
  Filled 2014-06-04 (×10): qty 1

## 2014-06-04 MED ORDER — ONDANSETRON HCL 4 MG PO TABS: 4.0000 mg | ORAL_TABLET | ORAL | Status: DC | PRN

## 2014-06-04 MED ORDER — PRENATAL MULTIVITAMIN CH
1.0000 | ORAL_TABLET | Freq: Every day | ORAL | Status: DC
  Administered 2014-06-04 – 2014-06-06 (×3): 1 via ORAL
  Filled 2014-06-04 (×3): qty 1

## 2014-06-04 MED ORDER — LACTATED RINGERS IV SOLN
INTRAVENOUS | Status: DC
  Administered 2014-06-04: 06:00:00 via INTRAUTERINE

## 2014-06-04 MED ORDER — SIMETHICONE 80 MG PO CHEW
80.0000 mg | CHEWABLE_TABLET | ORAL | Status: DC | PRN
Start: 2014-06-04 — End: 2014-06-06

## 2014-06-04 MED ORDER — HYDRALAZINE HCL 20 MG/ML IJ SOLN
10.0000 mg | Freq: Once | INTRAMUSCULAR | Status: AC
  Administered 2014-06-04: 10 mg via INTRAVENOUS
  Filled 2014-06-04: qty 1

## 2014-06-04 MED ORDER — DIBUCAINE 1 % RE OINT: 1.0000 "application " | TOPICAL_OINTMENT | RECTAL | Status: DC | PRN

## 2014-06-04 MED ORDER — MAGNESIUM SULFATE 40 G IN LACTATED RINGERS - SIMPLE
2.0000 g/h | INTRAVENOUS | Status: DC
  Administered 2014-06-04: 2 g/h via INTRAVENOUS
  Filled 2014-06-04 (×2): qty 500

## 2014-06-04 MED ORDER — LACTATED RINGERS IV SOLN
INTRAVENOUS | Status: DC
  Administered 2014-06-04: 10:00:00 via INTRAVENOUS

## 2014-06-04 MED ORDER — OXYCODONE-ACETAMINOPHEN 5-325 MG PO TABS: 1.0000 | ORAL_TABLET | ORAL | Status: DC | PRN

## 2014-06-04 MED ORDER — WITCH HAZEL-GLYCERIN EX PADS: 1.0000 "application " | MEDICATED_PAD | CUTANEOUS | Status: DC | PRN

## 2014-06-04 MED ORDER — ONDANSETRON HCL 4 MG/2ML IJ SOLN
4.0000 mg | INTRAMUSCULAR | Status: DC | PRN
Start: 2014-06-04 — End: 2014-06-06

## 2014-06-04 MED ORDER — OXYCODONE-ACETAMINOPHEN 5-325 MG PO TABS: 2.0000 | ORAL_TABLET | ORAL | Status: DC | PRN

## 2014-06-04 NOTE — Progress Notes (Signed)
UR chart review completed.  

## 2014-06-04 NOTE — Plan of Care (Signed)
Problem: Phase II Progression Outcomes Goal: Tolerating diet Outcome: Completed/Met Date Met:  06/04/14 Regular Diet.

## 2014-06-04 NOTE — Lactation Note (Signed)
This note was copied from the chart of Kara Ocie Bobna Mixson. Lactation Consultation Note  Initial visit made.  Breastfeeding consultation services and support information given to patient.  Mom states she does want to provide breastmilk for her baby.  Providing Breastmilk for your Baby in NICU booklet given to mom.  Discussed pumping schedule and what to expect the first few days and milk coming to volume.  Recommended mom check with her insurance company for a pump after discharge.  DEBP set up at bedside but mom asleep now so RN will initiate.  Patient Name: Kara Ocie Bobna Tiedeman [id number]'s Date: 06/04/2014 Reason for consult: Initial assessment;NICU baby;Late preterm infant   Maternal Data    Feeding    LATCH Score/Interventions                      Lactation Tools Discussed/Used Pump Review: Setup, frequency, and cleaning   Consult Status Consult Status: Follow-up    Huston FoleyMOULDEN, Sydna Brodowski S 06/04/2014, 11:15 AM

## 2014-06-04 NOTE — Progress Notes (Signed)
Kara Brady is a 28 y.o. G2P1001 at 9173w2d by admitted for induction of labor due to preeclampsia with severe range pressures.  Subjective: Pt reports stronger contractions, does not desire pain medication or epidural.  Coping well with contractions.   Objective: BP 159/92 mmHg  Pulse 71  Temp(Src) 97.5 F (36.4 C) (Oral)  Resp 20  Ht 5\' 2"  (1.575 m)  Wt 97.07 kg (214 lb)  BMI 39.13 kg/m2  SpO2 99%  LMP 10/15/2013 I/O last 3 completed shifts: In: 4232.7 [P.O.:1405; I.V.:2577.7; IV Piggyback:250] Out: 5100 [Urine:5100] Total I/O In: 1625 [P.O.:500; I.V.:1125] Out: 2150 [Urine:2150]  FHT:  FHR: 115 bpm, variability: moderate,  accelerations:  Present,  decelerations:  Absent UC:   regular, every 3 minutes SVE:   Dilation: 5 Effacement (%): 70 Station: -2 Exam by:: L. Leftwich-Kirby, CNM AROM with clear fluid.  Pt tolerated well.  IUPC placed without difficulty.    Labs: Lab Results  Component Value Date   WBC 12.4* 06/03/2014   HGB 12.8 06/03/2014   HCT 38.4 06/03/2014   MCV 88.7 06/03/2014   PLT 228 06/03/2014    Assessment / Plan: Induction of labor due to preeclampsia,  progressing well on pitocin  Labor: Progressing normally Preeclampsia:  on magnesium sulfate and labs stable Fetal Wellbeing:  Category I Pain Control:  Labor support without medications I/D:  n/a Anticipated MOD:  NSVD  LEFTWICH-KIRBY, LISA 06/04/2014, 4:08 AM

## 2014-06-04 NOTE — Progress Notes (Signed)
Kara Brady is a 28 y.o. G2P1001 at 7332w2d admitted for induction of labor due to preeclampsia.  Subjective: Pt uncomfortable, desires epidural.  Objective: BP 138/81 mmHg  Pulse 73  Temp(Src) 98.1 F (36.7 C) (Oral)  Resp 20  Ht 5\' 2"  (1.575 m)  Wt 97.07 kg (214 lb)  BMI 39.13 kg/m2  SpO2 99%  LMP 10/15/2013 I/O last 3 completed shifts: In: 4232.7 [P.O.:1405; I.V.:2577.7; IV Piggyback:250] Out: 5100 [Urine:5100] Total I/O In: 1625 [P.O.:500; I.V.:1125] Out: 2150 [Urine:2150]  FHT:  FHR: 115 bpm, variability: moderate,  accelerations:  Abscent,  decelerations:  Present variables with contractions UC:   regular, every 2-3 minutes SVE:   Dilation: 5 Effacement (%): 70 Station: -2 Exam by:: L. Leftwich-Kirby, CNM Amnioinfusion started with bolus of 300 ml, then 150 ml/hour  Labs: Lab Results  Component Value Date   WBC 12.4* 06/03/2014   HGB 12.8 06/03/2014   HCT 38.4 06/03/2014   MCV 88.7 06/03/2014   PLT 228 06/03/2014    Assessment / Plan: Induction of labor due to preeclampsia,  progressing well on pitocin  Labor: Progressing normally CBC drawn to eval platelets so pt can get epidural. Preeclampsia:  on magnesium sulfate and labs stable Fetal Wellbeing:  Category II Pain Control:  Labor support without medications I/D:  n/a Anticipated MOD:  NSVD  LEFTWICH-KIRBY, Chrisangel Eskenazi 06/04/2014, 5:34 AM

## 2014-06-04 NOTE — Progress Notes (Signed)
I spoke with pt while rounding on the unit.  Kara Brady is coping well and reports good support from her family.  She reports no particular needs at this time, but please page as needs arise.  Centex CorporationChaplain Katy Davey Limas Pager, [phone] 2:00 PM    06/04/14 1300  Clinical Encounter Type  Visited With Patient  Visit Type Initial

## 2014-06-05 ENCOUNTER — Encounter (HOSPITAL_COMMUNITY): Payer: Self-pay | Admitting: *Deleted

## 2014-06-05 LAB — CBC
HCT: 33 % — ABNORMAL LOW (ref 36.0–46.0)
Hemoglobin: 10.9 g/dL — ABNORMAL LOW (ref 12.0–15.0)
MCH: 29.5 pg (ref 26.0–34.0)
MCHC: 33 g/dL (ref 30.0–36.0)
MCV: 89.4 fL (ref 78.0–100.0)
PLATELETS: 224 10*3/uL (ref 150–400)
RBC: 3.69 MIL/uL — ABNORMAL LOW (ref 3.87–5.11)
RDW: 15.4 % (ref 11.5–15.5)
WBC: 12.8 10*3/uL — ABNORMAL HIGH (ref 4.0–10.5)

## 2014-06-05 MED ORDER — HYDROCHLOROTHIAZIDE 25 MG PO TABS
25.0000 mg | ORAL_TABLET | Freq: Every day | ORAL | Status: DC
  Administered 2014-06-05 – 2014-06-06 (×2): 25 mg via ORAL
  Filled 2014-06-05 (×3): qty 1

## 2014-06-05 NOTE — Progress Notes (Signed)
Clinical Social Work Department BRIEF PSYCHOSOCIAL ASSESSMENT 06/05/2014  Patient:  Kara Brady, Kara Brady     Account Number:  [id number]     Admit date:  06/02/2014  Clinical Social Worker:  Barbarann Ehlers  Date/Time:  06/05/2014 12:30 PM  Referred by:    Date Referred:    Other Referral:   No referral-NICU admission   Interview type:  Patient Other interview type:    PSYCHOSOCIAL DATA Living Status:  FAMILY Admitted from facility:   Level of care:   Primary support name:  Kara Brady Primary support relationship to patient:  SPOUSE Degree of support available:   MOB reports having a good support system.  She states her husband is involved and supportive as are her parents and sister.    CURRENT CONCERNS Current Concerns  None Noted   Other Concerns:    SOCIAL WORK ASSESSMENT / PLAN CSW met with MOB at baby's bedside and asked if CSW could sit with her while she visited with her son.  CSW completed assessment due to baby's premature birth and admission to NICU as well as introduced ongoing services offered by NICU CSW and offered support.  CSW discussed signs and symptoms of PPD, encouraged MOB not to have expectations, but rather to enjoy each day with baby in the hospital even though this was not her plan, and gave contact information.  CSW has no social concerns at this time and identifies no barriers to discharge when baby is medically ready.   Assessment/plan status:  Psychosocial Support/Ongoing Assessment of Needs Other assessment/ plan:   Information/referral to community resources:   No referral needs noted at this time.    PATIENT'S/FAMILY'S RESPONSE TO PLAN OF CARE: MOB was very pleasant and welcoming of CSW's visit at bedside.  She states she and baby are doing very well.  She reports no pregnancy or delivery concerns with her first son, however, that she is still in AICU at this time due to elevated blood pressure.  She states she is off magnesium and feels well now.   She reports coping well with baby's premature birth and admission to NICU even though it was not the plan and was unexpected.  She states she has a good support system and everything she needs for baby already at home.  She states she will have 6 weeks off from her job at [employer] in the [place], although her husband does not have much time off from his job constructing cubicles. They have one other son, age 18 Kara Brady).  [facility] in [place] with be baby's follow up provider.  MOB states no hx with PPD after first son's birth and has no questions, concerns or needs for CSW at this time.  She thanked CSW for the visit.

## 2014-06-05 NOTE — Progress Notes (Signed)
Post Partum Day 1 Subjective: no complaints, up ad lib, voiding and tolerating PO  Objective: I/O last 3 completed shifts: In: 6841.8 [P.O.:2065; I.V.:4776.8] Out: 8600 [Urine:8400; Blood:200] Total I/O In: 1977.9 [P.O.:530; I.V.:1447.9] Out: 3800 [Urine:3800]   Filed Vitals:   06/05/14 0300 06/05/14 0400 06/05/14 0500 06/05/14 0600  BP: 141/87 138/76 146/85 136/71  Pulse: 99 93 98 75  Temp:  97.9 F (36.6 C)    TempSrc:  Oral    Resp: 16 16 18 16   Height:      Weight:      SpO2: 97% 97% 97% 98%    Blood pressure 136/71, pulse 75, temperature 97.9 F (36.6 C), temperature source Oral, resp. rate 16, height 5\' 2"  (1.575 m), weight 206 lb 6.4 oz (93.622 kg), last menstrual period 10/15/2013, SpO2 98 %, unknown if currently breastfeeding.  Physical Exam:  General: alert, cooperative and no distress Lochia: appropriate Uterine Fundus: firm Incision: na DVT Evaluation: No evidence of DVT seen on physical exam.   Recent Labs  06/04/14 0525 06/05/14 0539  HGB 12.5 10.9*  HCT 37.9 33.0*    Assessment/Plan: Plan for discharge tomorrow, Breastfeeding and Contraception micronor  Transfer to floor today, Magnesium is off   LOS: 3 days   Kyl Givler H 06/05/2014, 6:45 AM

## 2014-06-05 NOTE — Progress Notes (Signed)
NST reactive on 05/27/14

## 2014-06-05 NOTE — Lactation Note (Signed)
This note was copied from the chart of Kara Brady Commisso. Lactation Consultation Note  Mom is obtaining drops pumping.  She plans to get a DEBP from her insurance company but may need a 2 week rental at discharge.  Patient Name: Kara Brady Trovato ZOXWR'UToday's Date: 06/05/2014     Maternal Data    Feeding Feeding Type: Formula Length of feed: 30 min  LATCH Score/Interventions                      Lactation Tools Discussed/Used     Consult Status      Huston FoleyMOULDEN, Saher Davee S 06/05/2014, 3:34 PM

## 2014-06-06 DIAGNOSIS — O1493 Unspecified pre-eclampsia, third trimester: Secondary | ICD-10-CM

## 2014-06-06 MED ORDER — IBUPROFEN 600 MG PO TABS: 600.0000 mg | ORAL_TABLET | Freq: Four times a day (QID) | ORAL | Status: DC

## 2014-06-06 MED ORDER — HYDROCHLOROTHIAZIDE 25 MG PO TABS: 25.0000 mg | ORAL_TABLET | Freq: Every day | ORAL | Status: DC

## 2014-06-06 MED ORDER — MEASLES, MUMPS & RUBELLA VAC ~~LOC~~ INJ
0.5000 mL | INJECTION | Freq: Once | SUBCUTANEOUS | Status: AC
  Administered 2014-06-06: 0.5 mL via SUBCUTANEOUS
  Filled 2014-06-06: qty 0.5

## 2014-06-06 NOTE — Lactation Note (Signed)
This note was copied from the chart of Boy Ocie Bobna Trindade. Lactation Consultation Note  Patient Name: Boy Ocie Bobna Finkbiner [id number]'BToday's Date: 06/06/2014 Reason for consult: Follow-up assessment;NICU baby;Late preterm infant   Follow up with mom who is being discharged today.  Mom is needing a 2 week rental for home use.  Rental packet given with instructions for completing, but mom stated she does not have any money in the room and needs to wait until her ride arrives to complete rental.  [facility] given to [facility] with instructions for mom to call LC when she is ready to complete rental.  Mom states she is pumping every 3 hours (6 times a day) but is only getting drops.  Mom stated she pumped for 6 weeks with first child and did not have any milk supply issues; denies any thyroid issues.  Encouragement given that delay in milk supply can occur with NICU births and encouraged STS with practice latching in NICU and then post-pumping.  Encouraged mom to use hands-on pumping with hand expression at end of pumping session to help increase milk output.  Also encouraged keeping pumping log and increasing pumping to every 2 hours during the day and at least once during the night for a minimum of 8 times per day.  Reviewed NICU booklet with mom.  Instructed mom to take circular filters from top of pump with her when she is discharged to use with home rental pump.  Mom stated she called insurance company but has to wait until she gets home to complete the pump request from [employer].  Encouraged to call for questions as needed after discharge.     Maternal Data    Feeding Feeding Type: Formula Length of feed: 30 min  LATCH Score/Interventions                      Lactation Tools Discussed/Used     Consult Status Consult Status: Complete    Lendon KaVann, Trevaris Pennella Walker 06/06/2014, 12:36 PM

## 2014-06-06 NOTE — Progress Notes (Signed)
Ambulated out  

## 2014-06-06 NOTE — Discharge Summary (Signed)
Obstetric Discharge Summary Reason for Admission: induction of labor secondary to severe preeclampsia Prenatal Procedures: none Intrapartum Procedures: spontaneous vaginal delivery Postpartum Procedures: none Complications-Operative and Postpartum: none HEMOGLOBIN  Date Value Ref Range Status  06/05/2014 10.9* 12.0 - 15.0 g/dL Final  16/10/960408/14/2015 54.012.4 g/dL Final   HCT  Date Value Ref Range Status  06/05/2014 33.0* 36.0 - 46.0 % Final  01/31/2014 36 % Final    Physical Exam:  General: alert, cooperative and no distress Lochia: appropriate Uterine Fundus: firm, NT DVT Evaluation: No evidence of DVT seen on physical exam. Negative Homan's sign. No cords or calf tenderness.  Discharge Diagnoses: Preelampsia and preterm delivery  Discharge Information: Date: 06/06/2014 Activity: pelvic rest Diet: routine Medications: PNV, Ibuprofen and hydrochlorothiazide Condition: stable Instructions: refer to practice specific booklet Discharge to: home Follow-up Information    [facility] [place]   Specialty:  Obstetrics and Gynecology   Why:  Call and schedule an appointment to be seen in 2 weeks   Contact information:   [address], [address] [place] [id number] [phone]      Newborn Data: Live born female  Birth Weight: 4 lb 11.5 oz (2140 g) APGAR: 9, 9  Will remain in NICU secondary to issues related to premature delivery.  Kara Brady 06/06/2014, 7:17 AM

## 2014-06-06 NOTE — Discharge Instructions (Signed)

## 2014-06-17 ENCOUNTER — Ambulatory Visit (INDEPENDENT_AMBULATORY_CARE_PROVIDER_SITE_OTHER): Payer: Self-pay | Admitting: *Deleted

## 2014-06-17 DIAGNOSIS — O1493 Unspecified pre-eclampsia, third trimester: Secondary | ICD-10-CM

## 2014-06-17 DIAGNOSIS — Z3483 Encounter for supervision of other normal pregnancy, third trimester: Secondary | ICD-10-CM

## 2014-06-18 ENCOUNTER — Ambulatory Visit (INDEPENDENT_AMBULATORY_CARE_PROVIDER_SITE_OTHER): Payer: Managed Care, Other (non HMO) | Admitting: Obstetrics & Gynecology

## 2014-06-18 ENCOUNTER — Encounter: Payer: Self-pay | Admitting: Obstetrics & Gynecology

## 2014-06-18 VITALS — BP 132/91 | HR 90 | Resp 16 | Ht 62.0 in | Wt 198.0 lb

## 2014-06-18 DIAGNOSIS — R52 Pain, unspecified: Secondary | ICD-10-CM

## 2014-06-18 DIAGNOSIS — O1493 Unspecified pre-eclampsia, third trimester: Secondary | ICD-10-CM

## 2014-06-18 DIAGNOSIS — O1400 Mild to moderate pre-eclampsia, unspecified trimester: Secondary | ICD-10-CM

## 2014-06-18 MED ORDER — IBUPROFEN 600 MG PO TABS: 600.0000 mg | ORAL_TABLET | Freq: Four times a day (QID) | ORAL | Status: DC

## 2014-06-18 NOTE — Progress Notes (Signed)
   Subjective:    Patient ID: Kara Brady, female    DOB: 12/02/1985, 28 y.o.   MRN: [id number]  HPI 28 yo P2 here 2 weeks postpartum s/p IOL for pre eclampsia at [redacted] weeks EGA. She is pumping as her son is in the NICU. Probably he will come home next week. She reports a decrease in her headaches, denies depression. She plans to wait at least 6 weeks for sex/contraception. She is on HCTZ 25 mg daily.   Review of Systems     Objective:   Physical Exam        Assessment & Plan:  PP- doing well RTC in 4 weeks for pp visit

## 2014-07-17 ENCOUNTER — Encounter: Payer: Self-pay | Admitting: Obstetrics & Gynecology

## 2014-07-17 ENCOUNTER — Ambulatory Visit (INDEPENDENT_AMBULATORY_CARE_PROVIDER_SITE_OTHER): Payer: Managed Care, Other (non HMO) | Admitting: Obstetrics & Gynecology

## 2014-07-17 DIAGNOSIS — Z30011 Encounter for initial prescription of contraceptive pills: Secondary | ICD-10-CM

## 2014-07-17 MED ORDER — NORGESTIMATE-ETH ESTRADIOL 0.25-35 MG-MCG PO TABS: 1.0000 | ORAL_TABLET | Freq: Every day | ORAL | Status: DC

## 2014-07-17 NOTE — Progress Notes (Signed)
Patient ID: Kara Brady, female   DOB: 10/01/1985, 29 y.o.   MRN: [id number] Post Partum Exam  Kara Simmerna G Villela is a 29 y.o. female  who presents for a postpartum visit. She is  6 weeks postpartum following a NSVD. I have fully reviewed the prenatal and intrapartum course. The delivery was at 2876w1d gestational weeks due to [facility]. Outcome: female boy 4lb 11oz sent to NICU for 3 weeks. Anesthesia: None. Postpartum course has been unremarkable Baby's course has been unremarkable since coming home. Baby is feeding by bottle. Bleeding has stopped .Marland Kitchen. Bowel function is normal. Bladder function is . Patient is not sexually active. Contraception method is none. Postpartum depression screening: is positive @ 12 Pt would like to be started on OCP  Review of Systems Pertinent items are noted in HPI.   Objective:    BP 116/78 mmHg  Pulse 78  Resp 16  Ht 5\' 5"  (1.651 m)  Wt 211 lb (95.709 kg)  BMI 35.11 kg/m2  Breastfeeding? Yes  General:  alert, cooperative and no distress   Breasts:  inspection negative, no nipple discharge or bleeding, no masses or nodularity palpable  Lungs: clear to auscultation bilaterally  Heart:  regular rate and rhythm  Abdomen: soft, non-tender; bowel sounds normal; no masses,  no organomegaly   Vulva:  normal  Vagina: normal vagina, no discharge, exudate, lesion, or erythema  Cervix:  no lesions  Corpus: normal size, contour, position, consistency, mobility, non-tender and enlarged, 0 weeks size  Adnexa:  normal adnexa  Rectal Exam: Not performed.        Assessment:    Nml postpartum exam. Pap smear not done at today's visit.   Plan:    1. Contraception: OCP (estrogen/progesterone) 2. 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 Follow up in: 9 months or as needed.

## 2015-02-17 ENCOUNTER — Ambulatory Visit: Payer: Managed Care, Other (non HMO) | Admitting: Obstetrics & Gynecology

## 2015-03-05 ENCOUNTER — Ambulatory Visit: Payer: Managed Care, Other (non HMO) | Admitting: Obstetrics & Gynecology

## 2015-03-09 ENCOUNTER — Ambulatory Visit (INDEPENDENT_AMBULATORY_CARE_PROVIDER_SITE_OTHER): Payer: Managed Care, Other (non HMO) | Admitting: Obstetrics and Gynecology

## 2015-03-09 ENCOUNTER — Encounter: Payer: Self-pay | Admitting: Obstetrics and Gynecology

## 2015-03-09 VITALS — BP 130/90 | HR 84 | Resp 16 | Ht 62.0 in | Wt 179.0 lb

## 2015-03-09 DIAGNOSIS — Z124 Encounter for screening for malignant neoplasm of cervix: Secondary | ICD-10-CM | POA: Diagnosis not present

## 2015-03-09 DIAGNOSIS — Z01419 Encounter for gynecological examination (general) (routine) without abnormal findings: Secondary | ICD-10-CM | POA: Diagnosis not present

## 2015-03-09 MED ORDER — NORGESTIMATE-ETH ESTRADIOL 0.25-35 MG-MCG PO TABS: 1.0000 | ORAL_TABLET | Freq: Every day | ORAL | Status: DC

## 2015-03-09 NOTE — Progress Notes (Signed)
  Subjective:     Kara Brady is a 29 y.o. female G2P2 with BMI 32 who is here for a comprehensive physical exam. The patient reports no problems. Patient is sexually active using condoms for contraception. She is interested in restarting OCP. She denies any abdominal pain or irregular vaginal bleeding  Social History   Social History  . Marital Status: Married    Spouse Name: N/A  . Number of Children: N/A  . Years of Education: N/A   Occupational History  . Recruitment consultant    Social History Main Topics  . Smoking status: Never Smoker   . Smokeless tobacco: Not on file  . Alcohol Use: No  . Drug Use: No  . Sexual Activity:    Partners: Male    Birth Control/ Protection: Condom   Other Topics Concern  . Not on file   Social History Narrative   Health Maintenance  Topic Date Due  . INFLUENZA VACCINE  01/19/2015  . PAP SMEAR  01/31/2017  . TETANUS/TDAP  04/22/2024  . HIV Screening  Completed   Past Medical History  Diagnosis Date  . History of migraine headaches   . Hypertension   . Headache    Past Surgical History  Procedure Laterality Date  . Breast biopsy     Family History  Problem Relation Age of Onset  . Hypertension Mother   . Diabetes Paternal Grandfather        Review of Systems Pertinent items are noted in HPI.   Objective:      GENERAL: Well-developed, well-nourished female in no acute distress.  HEENT: Normocephalic, atraumatic. Sclerae anicteric.  NECK: Supple. Normal thyroid.  LUNGS: Clear to auscultation bilaterally.  HEART: Regular rate and rhythm. BREASTS: Symmetric in size. No palpable masses or lymphadenopathy, skin changes, or nipple drainage. ABDOMEN: Soft, nontender, nondistended. No organomegaly. PELVIC: Normal external female genitalia. Vagina is pink and rugated.  Normal discharge. Normal appearing cervix. Uterus is normal in size. No adnexal mass or tenderness. EXTREMITIES: No cyanosis, clubbing, or edema, 2+  distal pulses.    Assessment:    Healthy female exam.      Plan:    pap smear collected Advised patient to perform monthly self breast and vulva exam Patient will be contacted with any abnormal results Rx Sprintec provided See After Visit Summary for Counseling Recommendations

## 2015-03-12 LAB — CYTOLOGY - PAP

## 2015-06-24 ENCOUNTER — Ambulatory Visit (INDEPENDENT_AMBULATORY_CARE_PROVIDER_SITE_OTHER): Payer: Managed Care, Other (non HMO) | Admitting: Osteopathic Medicine

## 2015-06-24 ENCOUNTER — Encounter: Payer: Self-pay | Admitting: Osteopathic Medicine

## 2015-06-24 VITALS — BP 133/87 | HR 72 | Ht 62.0 in | Wt 182.0 lb

## 2015-06-24 DIAGNOSIS — R635 Abnormal weight gain: Secondary | ICD-10-CM | POA: Diagnosis not present

## 2015-06-24 DIAGNOSIS — L659 Nonscarring hair loss, unspecified: Secondary | ICD-10-CM

## 2015-06-24 DIAGNOSIS — Z008 Encounter for other general examination: Secondary | ICD-10-CM

## 2015-06-24 DIAGNOSIS — Z0189 Encounter for other specified special examinations: Secondary | ICD-10-CM

## 2015-06-24 LAB — TSH: TSH: 1.048 u[IU]/mL (ref 0.350–4.500)

## 2015-06-24 LAB — LIPID PANEL
CHOLESTEROL: 218 mg/dL — AB (ref 125–200)
HDL: 71 mg/dL (ref >=46)
LDL Cholesterol: 102 mg/dL (ref <130)
Total CHOL/HDL Ratio: 3.1 Ratio (ref <=5.0)
Triglycerides: 224 mg/dL — ABNORMAL HIGH (ref <150)
VLDL: 45 mg/dL — AB (ref <30)

## 2015-06-24 LAB — CBC WITH DIFFERENTIAL/PLATELET
Basophils Absolute: 0 10*3/uL (ref 0.0–0.1)
Basophils Relative: 0 % (ref 0–1)
EOS ABS: 0.1 10*3/uL (ref 0.0–0.7)
EOS PCT: 1 % (ref 0–5)
HEMATOCRIT: 37.6 % (ref 36.0–46.0)
Hemoglobin: 13.1 g/dL (ref 12.0–15.0)
Lymphocytes Relative: 35 % (ref 12–46)
Lymphs Abs: 2.9 10*3/uL (ref 0.7–4.0)
MCH: 29.9 pg (ref 26.0–34.0)
MCHC: 34.8 g/dL (ref 30.0–36.0)
MCV: 85.8 fL (ref 78.0–100.0)
MONO ABS: 0.5 10*3/uL (ref 0.1–1.0)
MPV: 9.7 fL (ref 8.6–12.4)
Monocytes Relative: 6 % (ref 3–12)
Neutro Abs: 4.8 10*3/uL (ref 1.7–7.7)
Neutrophils Relative %: 58 % (ref 43–77)
PLATELETS: 319 10*3/uL (ref 150–400)
RBC: 4.38 MIL/uL (ref 3.87–5.11)
RDW: 14.3 % (ref 11.5–15.5)
WBC: 8.3 10*3/uL (ref 4.0–10.5)

## 2015-06-24 LAB — COMPLETE METABOLIC PANEL WITH GFR
ALT: 8 U/L (ref 6–29)
AST: 11 U/L (ref 10–30)
Albumin: 3.9 g/dL (ref 3.6–5.1)
Alkaline Phosphatase: 46 U/L (ref 33–115)
BUN: 9 mg/dL (ref 7–25)
CALCIUM: 9.1 mg/dL (ref 8.6–10.2)
CO2: 23 mmol/L (ref 20–31)
Chloride: 104 mmol/L (ref 98–110)
Creat: 0.55 mg/dL (ref 0.50–1.10)
GFR, Est African American: >89 mL/min (ref >=60)
GFR, Est Non African American: >89 mL/min (ref >=60)
Glucose, Bld: 80 mg/dL (ref 65–99)
POTASSIUM: 4.4 mmol/L (ref 3.5–5.3)
SODIUM: 137 mmol/L (ref 135–146)
Total Bilirubin: 0.4 mg/dL (ref 0.2–1.2)
Total Protein: 7 g/dL (ref 6.1–8.1)

## 2015-06-24 NOTE — Progress Notes (Signed)
HPI: Kara Brady is a 30 y.o. female who presents to [facility] today for chief complaint of:  Chief Complaint  Patient presents with  . Establish Care    Request biometric screening - has form for insurance.   Also concerned about weight gain and hair loss ongoing about a year or so. (+)FH thyroid disease. (+)fatigue. On OCP.    Past medical, social and family history reviewed: Past Medical History  Diagnosis Date  . History of migraine headaches   . Hypertension   . Headache    Past Surgical History  Procedure Laterality Date  . Breast biopsy     Social History  Substance Use Topics  . Smoking status: Never Smoker   . Smokeless tobacco: Not on file  . Alcohol Use: No   Family History  Problem Relation Age of Onset  . Hypertension Mother   . Diabetes Paternal Grandfather     Current Outpatient Prescriptions  Medication Sig Dispense Refill  . norgestimate-ethinyl estradiol (ORTHO-CYCLEN,SPRINTEC,PREVIFEM) 0.25-35 MG-MCG tablet Take 1 tablet by mouth daily. 1 Package 11  . ibuprofen (ADVIL,MOTRIN) 600 MG tablet Take 1 tablet (600 mg total) by mouth every 6 (six) hours. (Patient not taking: Reported on 06/24/2015) 30 tablet 0  . SUMAtriptan (IMITREX) 100 MG tablet Take 1 tablet (100 mg total) by mouth once as needed for migraine. May repeat in 2 hours if headache persists or recurs. 9 tablet 11  . topiramate (TOPAMAX) 100 MG tablet Take 100 mg by mouth. Reported on 06/24/2015     No current facility-administered medications for this visit.   No Known Allergies    Review of Systems: CONSTITUTIONAL:  No  fever, no chills, No  unintentional weight changes HEAD/EYES/EARS/NOSE/THROAT: No  headache, no vision change, no hearing change, No  sore throat, No  sinus pressure CARDIAC: No  chest pain, No  pressure, No palpitations, No  orthopnea RESPIRATORY: No  cough, No  shortness of breath/wheeze GASTROINTESTINAL: No  nausea, No  vomiting, No   abdominal pain, No  blood in stool, No  diarrhea, No  constipation  MUSCULOSKELETAL: No  myalgia/arthralgia GENITOURINARY: No  incontinence, No  abnormal genital bleeding/discharge SKIN: No  rash/wounds/concerning lesions HEM/ONC: No  easy bruising/bleeding, No  abnormal lymph node ENDOCRINE: No  polyuria/polydipsia/polyphagia, No  heat/cold intolerance  NEUROLOGIC: No  weakness, No  dizziness, No  slurred speech PSYCHIATRIC: No  concerns with depression, No  concerns with anxiety, No sleep problems     Exam:  BP 133/87 mmHg  Pulse 72  Ht 5\' 2"  (1.575 m)  Wt 182 lb (82.555 kg)  BMI 33.28 kg/m2 Constitutional: VS see above. General Appearance: alert, well-developed, well-nourished, NAD Eyes: Normal lids and conjunctive, non-icteric sclera, PERRLA Ears, Nose, Mouth, Throat: MMM, Normal external inspection ears/nares/mouth/lips/gums, TM normal, posterior pharynx No  erythema No  exudate Neck: No masses, trachea midline. No thyroid enlargement/tenderness/mass appreciated. No lymphadenopathy Respiratory: Normal respiratory effort. no wheeze, no rhonchi, no rales Cardiovascular: S1/S2 normal, no murmur, no rub/gallop auscultated. RRR.  No carotid bruit or JVD. No abdominal aortic bruit.  Pedal pulse II/IV bilaterally DP and PT.  No lower extremity edema. Gastrointestinal: Nontender, no masses. No hepatomegaly, no splenomegaly. No hernia appreciated. Bowel sounds normal. Rectal exam deferred.  Musculoskeletal: Gait normal. No clubbing/cyanosis of digits.  Neurological: No cranial nerve deficit on limited exam. Motor and sensation intact and symmetric Skin: warm, dry, intact. No rash/ulcer. No concerning nevi or subq nodules on limited exam.  Psychiatric: Normal judgment/insight. Normal mood and affect. Oriented x3.       ASSESSMENT/PLAN:  Encounter for biometric screening - Plan: COMPLETE METABOLIC PANEL WITH GFR, Lipid panel  Weight gain - Plan: TSH  Hair loss - Plan: CBC with  Differential/Platelet, TSH   G2P2 30yo and 30yo   FEMALE PREVENTIVE CARE  ANNUAL SCREENING/COUNSELING Tobacco - Never  Alcohol - none Diet/Exercise - HEALTHY HABITS DISCUSSED TO DECREASE CV RISK - nothing in particular  Sexual Health - Yes with female. STI - The patient reports a past history of: rather not say. INTERESTED IN STI TESTING - no Depression - PQH2 Negative Domestic violence concerns - no HTN SCREENING - SEE VITALS Vaccination status - SEE BELOW  INFECTIOUS DISEASE SCREENING HIV - all adults 15-65 - does not need GC/CT - sexually active - does not need HepC - born 72-1966 - does not need TB - if risk/required by employer - does not need  DISEASE SCREENING Lipid - (Low risk screen M35/F45; High risk screen M25/F35 if HTN, Tob, FH CHD M<55/F<65) - needs DM2 (45+ or Risk = FH 1st deg DM, Hx GDM, overweight/sedentary, high-risk ethnicity, HTN) - needs Osteoporosis - age 87+ or one sooner if risk - does not need  CANCER SCREENING Cervical - Pap q3 yr age 86+, Pap + HPV q5y age 11+ - PAP - does not need Breast - Mammo age 62+ (C) and biennial age 39-75 (A) - MAMMO - does not need Lung - annual low dose CT Chest age 7-75 w/ 30+ PY, current/quit past 15 years - CT - does not need Colon - age 76+ or 31 years of age prior to FH Dx - GI REFERRAL - does not need  ADULT VACCINATION Influenza - annual - already has Td booster every 10 years - already has HPV - age <30yo - was not indicated Zoster - age 72+ - was not indicated Pneumonia - age 87+ sooner if risk (DM, smoker, other) - was not indicated  OTHER Fall - exercise and Vit D age 87+ - does not need Consider ASA - age 53-59 - does not need    Return in about 1 year (around 06/23/2016), or sooner if needed, for ANNUAL PHYSICAL.

## 2015-06-24 NOTE — Patient Instructions (Signed)
Thanks for coming in today! We will call you once your lab results are available and your paperwork is completed - you can pick up the paperwork at the front desk or we can fax it for you. If anything abnormal turns up in your blood work, we may ask you to make an appointment with Dr. Lyn HollingsheadAlexander to discuss it. Otherwise, follow up with Dr. Lyn HollingsheadAlexander in one year for annual check-up, sooner if you need anything or if any problems arise - don't wait!

## 2015-06-26 ENCOUNTER — Telehealth: Payer: Self-pay

## 2015-08-22 ENCOUNTER — Other Ambulatory Visit: Payer: Self-pay | Admitting: Obstetrics & Gynecology

## 2015-11-13 ENCOUNTER — Ambulatory Visit: Payer: Managed Care, Other (non HMO) | Admitting: Osteopathic Medicine

## 2015-11-13 ENCOUNTER — Ambulatory Visit (INDEPENDENT_AMBULATORY_CARE_PROVIDER_SITE_OTHER): Payer: Managed Care, Other (non HMO) | Admitting: Osteopathic Medicine

## 2015-11-13 ENCOUNTER — Encounter: Payer: Self-pay | Admitting: Osteopathic Medicine

## 2015-11-13 ENCOUNTER — Telehealth: Payer: Self-pay

## 2015-11-13 VITALS — BP 145/91 | HR 82 | Ht 62.0 in | Wt 181.0 lb

## 2015-11-13 DIAGNOSIS — R319 Hematuria, unspecified: Secondary | ICD-10-CM

## 2015-11-13 DIAGNOSIS — M545 Low back pain, unspecified: Secondary | ICD-10-CM

## 2015-11-13 DIAGNOSIS — M7632 Iliotibial band syndrome, left leg: Secondary | ICD-10-CM | POA: Diagnosis not present

## 2015-11-13 LAB — POCT URINALYSIS DIPSTICK
Bilirubin, UA: NEGATIVE
GLUCOSE UA: NEGATIVE
Leukocytes, UA: NEGATIVE
Nitrite, UA: NEGATIVE
Protein, UA: NEGATIVE
Urobilinogen, UA: 0.2
pH, UA: 6.5

## 2015-11-13 MED ORDER — MELOXICAM 7.5 MG PO TABS: 7.5000 mg | ORAL_TABLET | Freq: Two times a day (BID) | ORAL | Status: DC | PRN

## 2015-11-13 MED ORDER — KETOROLAC TROMETHAMINE 60 MG/2ML IM SOLN
60.0000 mg | Freq: Once | INTRAMUSCULAR | Status: AC
  Administered 2015-11-13: 60 mg via INTRAMUSCULAR

## 2015-11-13 NOTE — Telephone Encounter (Signed)
Error

## 2015-11-13 NOTE — Progress Notes (Signed)
HPI: Kara Brady is a 30 y.o. female who presents to [facility] today for chief complaint of:  Chief Complaint  Patient presents with  . Back Pain    low pain    BACK PAIN . Location: lower on L gong down side of leg . Quality: tingling pain  . Duration: 8 months ago same lower back pain, was told had sciatica. This pain went away for a time but came back over the past week or so.  . Timing: comes and goes,  . Context: no injury     Past medical, social and family history reviewed: Past Medical History  Diagnosis Date  . History of migraine headaches   . Hypertension   . Headache    Past Surgical History  Procedure Laterality Date  . Breast biopsy     Social History  Substance Use Topics  . Smoking status: Never Smoker   . Smokeless tobacco: Not on file  . Alcohol Use: No   Family History  Problem Relation Age of Onset  . Hypertension Mother   . Diabetes Paternal Grandfather     Current Outpatient Prescriptions  Medication Sig Dispense Refill  . ibuprofen (ADVIL,MOTRIN) 600 MG tablet Take 1 tablet (600 mg total) by mouth every 6 (six) hours. 30 tablet 0  . SPRINTEC 28 0.25-35 MG-MCG tablet TAKE 1 TABLET BY MOUTH EVERY DAY 84 tablet 3  . topiramate (TOPAMAX) 100 MG tablet Take 100 mg by mouth. Reported on 06/24/2015    . SUMAtriptan (IMITREX) 100 MG tablet Take 1 tablet (100 mg total) by mouth once as needed for migraine. May repeat in 2 hours if headache persists or recurs. 9 tablet 11   No current facility-administered medications for this visit.   No Known Allergies    Review of Systems: CONSTITUTIONAL:  No  Recent illness HEAD/EYES/EARS/NOSE/THROAT: No  headache, no vision change, CARDIAC: No  chest pain GASTROINTESTINAL: No  nausea, No  vomiting, No  abdominal pain MUSCULOSKELETAL: (+) myalgia/arthralgia GENITOURINARY: No  incontinence, No  abnormal genital bleeding/discharge NEUROLOGIC: No  weakness  Exam:  BP 145/91  mmHg  Pulse 82  Ht 5\' 2"  (1.575 m)  Wt 181 lb (82.101 kg)  BMI 33.10 kg/m2 Constitutional: VS see above. General Appearance: alert, well-developed, well-nourished, NAD Eyes: Normal lids and conjunctive, non-icteric sclera Ears, Nose, Mouth, Throat: MMM, Normal external inspection ears/nares/mouth/lips/gums Neck: No masses, trachea midline Respiratory: Normal respiratory effort.  Cardiovascular: No lower extremity edema. Musculoskeletal: Gait normal. No clubbing/cyanosis of digits. (+) paraspinal tenderness L lumbar spine and L SI joint, neg log roll, (+) pain on Ober test, negative external/internal rotation impingement tests, neg FABER test Neurological: No cranial nerve deficit on limited exam. Motor and sensation intact and symmetric Skin: warm, dry, intact. No rash/ulcer. No concerning nevi or subq nodules on limited exam.   Psychiatric: Normal judgment/insight. Normal mood and affect. Oriented x3.    Results for orders placed or performed in visit on 11/13/15 (from the past 72 hour(s))  POCT Urinalysis Dipstick     Status: None   Collection Time: 11/13/15  9:52 AM  Result Value Ref Range   Color, UA YELLOW    Clarity, UA CLEAR    Glucose, UA NEGATIVE    Bilirubin, UA NEGATIVE    Ketones, UA TRACE    Spec Grav, UA >=1.030    Blood, UA MODERATE    pH, UA 6.5    Protein, UA NEGATIVE    Urobilinogen, UA 0.2  Nitrite, UA NEGATIVE    Leukocytes, UA Negative Negative      ASSESSMENT/PLAN: Some LBP likely ITB or TFL pain, home exercises and NSAIDS advised, imaging if persists but I don't think this will yield much information at this point. Consider physical therapy referral if no improvement. Repeat UA on next visit due to micro blood.   Left-sided low back pain without sciatica - Plan: POCT Urinalysis Dipstick, meloxicam (MOBIC) 7.5 MG tablet, ketorolac (TORADOL) injection 60 mg, CANCELED: Urine Culture  Iliotibial band syndrome, left - Plan: meloxicam (MOBIC) 7.5 MG  tablet  Hematuria - Plan: Urinalysis, microscopic only   OMT performed - MFR and HVLA to lumbar and sacral spine, pelvis.   All questions were answered. Visit summary with updated medication list and pertinent instructions was printed for patient. ER/RTC precautions were reviewed with the patient. Return if symptoms worsen or fail to improve - let us know and we can place an order for physical therapy.

## 2015-11-13 NOTE — Patient Instructions (Signed)
Iliotibial Band Syndrome With Rehab The iliotibial (IT) band is a tendon that connects the hip muscles to the shinbone (tibia) and to one of the bones of the pelvis (ileum). The IT band passes by the knee and is often irritated by the outer portion of the knee (lateral femoral condyle). A fluid filled sac (bursa) exists between the tendon and the bone, to cushion and reduce friction. Overuse of the tendon may cause excessive friction, which results in IT band syndrome. This condition involves inflammation of the bursa (bursitis) and/or inflammation of the IT band (tendinitis). SYMPTOMS   Pain, tenderness, swelling, warmth, or redness over the IT band, at the outer knee (above the joint).  Pain that travels up or down the thigh or leg.  Initially, pain at the beginning of an exercise, that decreases once warmed up. Eventually, pain throughout the activity, getting worse as the activity continues. May cause the athlete to stop in the middle of training or competing.  Pain that gets worse when running down hills or stairs, on banked tracks, or next to the curb on the street.  Pain that increases when the foot of the affected leg hits the ground.  Possibly, a crackling sound (crepitation) when the tendon or bursa is moved or touched. CAUSES  IT band syndrome is caused by irritation of the IT band and the underlying bursa. This eventually results in inflammation and pain. IT band syndrome is an overuse injury.  RISK INCREASES WITH:  Sports with repetitive knee-bending activities (distance running, cycling).  Incorrect training techniques, including sudden changes in the intensity, frequency, or duration of training.  Not enough rest between workouts.  Poor strength and flexibility, especially a tight IT band.  Failure to warm up properly before activity.  Bow legs.  Arthritis of the knee. PREVENTION   Warm up and stretch properly before activity.  Allow for adequate recovery between  workouts.  Maintain physical fitness:  Strength, flexibility, and endurance.  Cardiovascular fitness.  Learn and use proper training technique, including reducing running mileage, shortening stride, and avoiding running on hills and banked surfaces.  Wear arch supports (orthotics), if you have flat feet. PROGNOSIS  If treated properly, IT band syndrome usually goes away within 6 weeks of treatment. RELATED COMPLICATIONS   Longer healing time, if not properly treated, or if not given enough time to heal.  Recurring inflammation of the tendon and bursa, that may result in a chronic condition.  Recurring symptoms, if activity is resumed too soon, with overuse, with a direct blow, or with poor training technique.  Inability to complete training or competition. TREATMENT  Treatment first involves the use of ice and medicine, to reduce pain and inflammation. The use of strengthening and stretching exercises may help reduce pain with activity. These exercises may be performed at home or with a therapist. For individuals with flat feet, an arch support (orthotic) may be helpful. Some individuals find that wearing a knee sleeve or compression bandage around the knee during workouts provides some relief. Certain training techniques, such as adjusting stride length, avoiding running on hills or stairs, changing the direction you run on a circular or banked track, or changing the side of the road you run on, if you run next to the curb, may help decrease symptoms of IT band syndrome. Cyclists may need to change the seat height or foot position on their bicycles. An injection of cortisone into the bursa may be recommended. Surgery to remove the inflamed bursa and/or part   of the IT band is only considered after at least 6 months of non-surgical treatment.  MEDICATION   If pain medicine is needed, nonsteroidal anti-inflammatory medicines (aspirin and ibuprofen), or other minor pain relievers  (acetaminophen), are often advised.  Do not take pain medicine for 7 days before surgery.  Prescription pain relievers may be given, if your caregiver thinks they are needed. Use only as directed and only as much as you need.  Corticosteroid injections may be given by your caregiver. These injections should be reserved for the most serious cases, because they may only be given a certain number of times. HEAT AND COLD  Cold treatment (icing) should be applied for 10 to 15 minutes every 2 to 3 hours for inflammation and pain, and immediately after activity that aggravates your symptoms. Use ice packs or an ice massage.  Heat treatment may be used before performing stretching and strengthening activities prescribed by your caregiver, physical therapist, or athletic trainer. Use a heat pack or a warm water soak. SEEK MEDICAL CARE IF:   Symptoms get worse or do not improve in 2 to 4 weeks, despite treatment.  New, unexplained symptoms develop. (Drugs used in treatment may produce side effects.) EXERCISES  RANGE OF MOTION (ROM) AND STRETCHING EXERCISES - Iliotibial Band Syndrome These exercises may help you when beginning to rehabilitate your injury. Your symptoms may go away with or without further involvement from your physician, physical therapist or athletic trainer. While completing these exercises, remember:   Restoring tissue flexibility helps normal motion to return to the joints. This allows healthier, less painful movement and activity.  An effective stretch should be held for at least 30 seconds.  A stretch should never be painful. You should only feel a gentle lengthening or release in the stretched tissue. STRETCH - Quadriceps, Prone   Lie on your stomach on a firm surface, such as a bed or padded floor.  Bend your right / left knee and grasp your ankle. If you are unable to reach your ankle or pant leg, use a belt around your foot to lengthen your reach.  Gently pull your heel  toward your buttocks. Your knee should not slide out to the side. You should feel a stretch in the front of your thigh and knee.  Hold this position for.  STRETCH - Iliotibial Band  On the floor or bed, lie on your side, so your right / left leg is on top. Bend your knee and grab your ankle.  Slowly bring your knee back so that your thigh is in line with your trunk. Keep your heel at your buttocks and gently arch your back, so your head, shoulders and hips line up.  Slowly lower your leg so that your knee approaches the floor or bed, until you feel a gentle stretch on the outside of your right / left thigh. If you do not feel a stretch and your knee will not fall farther, place the heel of your opposite foot on top of your knee, and pull your thigh down farther.  Hold this stretch  STRENGTHENING EXERCISES - Iliotibial Band Syndrome Improving the flexibility of the IT band will best relieve your discomfort due to IT band syndrome. Strengthening exercises, however, can help improve both muscle endurance and joint mechanics, reducing the factors that can contribute to this condition. Your physician, physical therapist or athletic trainer may provide you with exercises that train specific muscle groups that are especially weak. The following exercises target muscles that  are often weak in people who have IT band syndrome. STRENGTH - Hip Abductors, Straight Leg Raises  Be aware of your form throughout the entire exercise, so that you exercise the correct muscles. Poor form means that you are not strengthening the correct muscles.  Lie on your side, so that your head, shoulders, knee and hip line up. You may bend your lower knee to help maintain your balance. Your right / left leg should be on top.  Roll your hips slightly forward, so that your hips are stacked directly over each other and your right / left knee is facing forward.  Lift your top leg up 4-6 inches, leading with your heel. Be sure that  your foot does not drift forward and that your knee does not roll toward the ceiling.  Hold this position. You should feel the muscles in your outer hip lifting (you may not notice this until your leg begins to tire).  Slowly lower your leg to the starting position. Allow the muscles to fully relax before beginning the next repetition.  STRENGTH - Quad/VMO, Isometric  Sit in a chair with your right / left knee slightly bent. With your fingertips, feel the VMO muscle (just above the inside of your knee). The VMO is important in controlling the position of your kneecap.  Keeping your fingertips on this muscle. Without actually moving your leg, attempt to drive your knee down, as if straightening your leg. You should feel your VMO tense. If you have a difficult time, you may wish to try the same exercise on your healthy knee first.  Tense this muscle as hard as you can, without increasing any knee pain.  Hold this position. Relax the muscles slowly and completely between each repetition.   This information is not intended to replace advice given to you by your health care provider. Make sure you discuss any questions you have with your health care provider.   Document Released: 06/06/2005 Document Revised: 06/27/2014 Document Reviewed: 09/18/2008 [employer] Interactive Patient Education 2016 [employer]    Low Back Sprain With Rehab A sprain is an injury in which a ligament is torn. The ligaments of the lower back are vulnerable to sprains. However, they are strong and require great force to be injured. These ligaments are important for stabilizing the spinal column. Sprains are classified into three categories. Grade 1 sprains cause pain, but the tendon is not lengthened. Grade 2 sprains include a lengthened ligament, due to the ligament being stretched or partially ruptured. With grade 2 sprains there is still function, although the function may be decreased. Grade 3 sprains involve a  complete tear of the tendon or muscle, and function is usually impaired. SYMPTOMS   Severe pain in the lower back.  Sometimes, a feeling of a "pop," "snap," or tear, at the time of injury.  Tenderness and sometimes swelling at the injury site.  Uncommonly, bruising (contusion) within 48 hours of injury.  Muscle spasms in the back. CAUSES  Low back sprains occur when a force is placed on the ligaments that is greater than they can handle. Common causes of injury include:  Performing a stressful act while off-balance.  Repetitive stressful activities that involve movement of the lower back.  Direct hit (trauma) to the lower back. RISK INCREASES WITH:  Contact sports (football, wrestling).  Collisions (major skiing accidents).  Sports that require throwing or lifting (baseball, weightlifting).  Sports involving twisting of the spine (gymnastics, diving, tennis, golf).  Poor strength and  flexibility.  Inadequate protection.  Previous back injury or surgery (especially fusion). PREVENTION  Wear properly fitted and padded protective equipment.  Warm up and stretch properly before activity.  Allow for adequate recovery between workouts.  Maintain physical fitness:  Strength, flexibility, and endurance.  Cardiovascular fitness.  Maintain a healthy body weight. PROGNOSIS  If treated properly, low back sprains usually heal with non-surgical treatment. The length of time for healing depends on the severity of the injury.  RELATED COMPLICATIONS   Recurring symptoms, resulting in a chronic problem.  Chronic inflammation and pain in the low back.  Delayed healing or resolution of symptoms, especially if activity is resumed too soon.  Prolonged impairment.  Unstable or arthritic joints of the low back. TREATMENT  Treatment first involves the use of ice and medicine, to reduce pain and inflammation. The use of strengthening and stretching exercises may help reduce pain  with activity. These exercises may be performed at home or with a therapist. Severe injuries may require referral to a therapist for further evaluation and treatment, such as ultrasound. Your caregiver may advise that you wear a back brace or corset, to help reduce pain and discomfort. Often, prolonged bed rest results in greater harm then benefit. Corticosteroid injections may be recommended. However, these should be reserved for the most serious cases. It is important to avoid using your back when lifting objects. At night, sleep on your back on a firm mattress, with a pillow placed under your knees. If non-surgical treatment is unsuccessful, surgery may be needed.  MEDICATION   If pain medicine is needed, nonsteroidal anti-inflammatory medicines (aspirin and ibuprofen), or other minor pain relievers (acetaminophen), are often advised.  Do not take pain medicine for 7 days before surgery.  Prescription pain relievers may be given, if your caregiver thinks they are needed. Use only as directed and only as much as you need.  Ointments applied to the skin may be helpful.  Corticosteroid injections may be given by your caregiver. These injections should be reserved for the most serious cases, because they may only be given a certain number of times. HEAT AND COLD  Cold treatment (icing) should be applied for 10 to 15 minutes every 2 to 3 hours for inflammation and pain, and immediately after activity that aggravates your symptoms. Use ice packs or an ice massage.  Heat treatment may be used before performing stretching and strengthening activities prescribed by your caregiver, physical therapist, or athletic trainer. Use a heat pack or a warm water soak. SEEK MEDICAL CARE IF:   Symptoms get worse or do not improve in 2 to 4 weeks, despite treatment.  You develop numbness or weakness in either leg.  You lose bowel or bladder function.  Any of the following occur after surgery: fever, increased  pain, swelling, redness, drainage of fluids, or bleeding in the affected area.  New, unexplained symptoms develop. (Drugs used in treatment may produce side effects.) EXERCISES  RANGE OF MOTION (ROM) AND STRETCHING EXERCISES - Low Back Sprain Most people with lower back pain will find that their symptoms get worse with excessive bending forward (flexion) or arching at the lower back (extension). The exercises that will help resolve your symptoms will focus on the opposite motion.  Your physician, physical therapist or athletic trainer will help you determine which exercises will be most helpful to resolve your lower back pain. Do not complete any exercises without first consulting with your caregiver. Discontinue any exercises which make your symptoms worse, until  you speak to your caregiver. If you have pain, numbness or tingling which travels down into your buttocks, leg or foot, the goal of the therapy is for these symptoms to move closer to your back and eventually resolve. Sometimes, these leg symptoms will get better, but your lower back pain may worsen. This is often an indication of progress in your rehabilitation. Be very alert to any changes in your symptoms and the activities in which you participated in the 24 hours prior to the change. Sharing this information with your caregiver will allow him or her to most efficiently treat your condition. These exercises may help you when beginning to rehabilitate your injury. Your symptoms may resolve with or without further involvement from your physician, physical therapist or athletic trainer. While completing these exercises, remember:   Restoring tissue flexibility helps normal motion to return to the joints. This allows healthier, less painful movement and activity.  An effective stretch should be held for at least 30 seconds.  A stretch should never be painful. You should only feel a gentle lengthening or release in the stretched  tissue. FLEXION RANGE OF MOTION AND STRETCHING EXERCISES: STRETCH - Flexion, Single Knee to Chest   Lie on a firm bed or floor with both legs extended in front of you.  Keeping one leg in contact with the floor, bring your opposite knee to your chest. Hold your leg in place by either grabbing behind your thigh or at your knee.  Pull until you feel a gentle stretch in your low back. Hold  STRETCH - Flexion, Double Knee to Chest  Lie on a firm bed or floor with both legs extended in front of you.  Keeping one leg in contact with the floor, bring your opposite knee to your chest.  Tense your stomach muscles to support your back and then lift your other knee to your chest. Hold your legs in place by either grabbing behind your thighs or at your knees  Pull both knees toward your chest until you feel a gentle stretch in your low back. Hold STRETCH - Low Trunk Rotation  Lie on a firm bed or floor. Keeping your legs in front of you, bend your knees so they are both pointed toward the ceiling and your feet are flat on the floor.  Extend your arms out to the side. This will stabilize your upper body by keeping your shoulders in contact with the floor.  Gently and slowly drop both knees together to one side until you feel a gentle stretch in your low back. Hold   Tense your stomach muscles to support your lower back as you bring your knees back to the starting position. Repeat the exercise to the other side.  EXTENSION RANGE OF MOTION AND FLEXIBILITY EXERCISES: STRETCH - Extension, Prone on Elbows   Lie on your stomach on the floor, a bed will be too soft. Place your palms about shoulder width apart and at the height of your head.  Place your elbows under your shoulders. If this is too painful, stack pillows under your chest.  Allow your body to relax so that your hips drop lower and make contact more completely with the floor.  Hold this position   Slowly return to lying flat on the  floor. Marland Kitchen  RANGE OF MOTION - Extension, Prone Press Ups  Lie on your stomach on the floor, a bed will be too soft. Place your palms about shoulder width apart and at the height of  your head.  Keeping your back as relaxed as possible, slowly straighten your elbows while keeping your hips on the floor. You may adjust the placement of your hands to maximize your comfort. As you gain motion, your hands will come more underneath your shoulders.  Hold this position   Slowly return to lying flat on the floor.   RANGE OF MOTION- Quadruped, Neutral Spine   Assume a hands and knees position on a firm surface. Keep your hands under your shoulders and your knees under your hips. You may place padding under your knees for comfort.  Drop your head and point your tailbone toward the ground below you. This will round out your lower back like an angry cat. Hold this position   Slowly lift your head and release your tail bone so that your back sags into a large arch, like an old horse.  Hold this position   Repeat this until you feel limber in your low back.  Now, find your "sweet spot." This will be the most comfortable position somewhere between the two previous positions. This is your neutral spine. Once you have found this position, tense your stomach muscles to support your low back.  Hold this position   STRENGTHENING EXERCISES - Low Back Sprain These exercises may help you when beginning to rehabilitate your injury. These exercises should be done near your "sweet spot." This is the neutral, low-back arch, somewhere between fully rounded and fully arched, that is your least painful position. When performed in this safe range of motion, these exercises can be used for people who have either a flexion or extension based injury. These exercises may resolve your symptoms with or without further involvement from your physician, physical therapist or athletic trainer. While completing these exercises,  remember:   Muscles can gain both the endurance and the strength needed for everyday activities through controlled exercises.  Complete these exercises as instructed by your physician, physical therapist or athletic trainer. Increase the resistance and repetitions only as guided.  You may experience muscle soreness or fatigue, but the pain or discomfort you are trying to eliminate should never worsen during these exercises. If this pain does worsen, stop and make certain you are following the directions exactly. If the pain is still present after adjustments, discontinue the exercise until you can discuss the trouble with your caregiver. STRENGTHENING - Deep Abdominals, Pelvic Tilt   Lie on a firm bed or floor. Keeping your legs in front of you, bend your knees so they are both pointed toward the ceiling and your feet are flat on the floor.  Tense your lower abdominal muscles to press your low back into the floor. This motion will rotate your pelvis so that your tail bone is scooping upwards rather than pointing at your feet or into the floor.  With a gentle tension and even breathing, hold this position  STRENGTHENING - Abdominals, Crunches   Lie on a firm bed or floor. Keeping your legs in front of you, bend your knees so they are both pointed toward the ceiling and your feet are flat on the floor. Cross your arms over your chest.  Slightly tip your chin down without bending your neck.  Tense your abdominals and slowly lift your trunk high enough to just clear your shoulder blades. Lifting higher can put excessive stress on the lower back and does not further strengthen your abdominal muscles.  Control your return to the starting position.   STRENGTHENING - Quadruped, Opposite  UE/LE Lift   Assume a hands and knees position on a firm surface. Keep your hands under your shoulders and your knees under your hips. You may place padding under your knees for comfort.  Find your neutral spine  and gently tense your abdominal muscles so that you can maintain this position. Your shoulders and hips should form a rectangle that is parallel with the floor and is not twisted.  Keeping your trunk steady, lift your right hand no higher than your shoulder and then your left leg no higher than your hip. Make sure you are not holding your breath. Hold this position  Continuing to keep your abdominal muscles tense and your back steady, slowly return to your starting position. Repeat with the opposite arm and leg.  STRENGTHENING - Abdominals and Quadriceps, Straight Leg Raise   Lie on a firm bed or floor with both legs extended in front of you.  Keeping one leg in contact with the floor, bend the other knee so that your foot can rest flat on the floor.  Find your neutral spine, and tense your abdominal muscles to maintain your spinal position throughout the exercise.  Slowly lift your straight leg off the floor about 6 inches for a count of 15, making sure to not hold your breath.  Still keeping your neutral spine, slowly lower your leg all the way to the floor.  POSTURE AND BODY MECHANICS CONSIDERATIONS - Low Back Sprain Keeping correct posture when sitting, standing or completing your activities will reduce the stress put on different body tissues, allowing injured tissues a chance to heal and limiting painful experiences. The following are general guidelines for improved posture. Your physician or physical therapist will provide you with any instructions specific to your needs. While reading these guidelines, remember:  The exercises prescribed by your provider will help you have the flexibility and strength to maintain correct postures.  The correct posture provides the best environment for your joints to work. All of your joints have less wear and tear when properly supported by a spine with good posture. This means you will experience a healthier, less painful body.  Correct posture must  be practiced with all of your activities, especially prolonged sitting and standing. Correct posture is as important when doing repetitive low-stress activities (typing) as it is when doing a single heavy-load activity (lifting). RESTING POSITIONS Consider which positions are most painful for you when choosing a resting position. If you have pain with flexion-based activities (sitting, bending, stooping, squatting), choose a position that allows you to rest in a less flexed posture. You would want to avoid curling into a fetal position on your side. If your pain worsens with extension-based activities (prolonged standing, working overhead), avoid resting in an extended position such as sleeping on your stomach. Most people will find more comfort when they rest with their spine in a more neutral position, neither too rounded nor too arched. Lying on a non-sagging bed on your side with a pillow between your knees, or on your back with a pillow under your knees will often provide some relief. Keep in mind, being in any one position for a prolonged period of time, no matter how correct your posture, can still lead to stiffness. PROPER SITTING POSTURE In order to minimize stress and discomfort on your spine, you must sit with correct posture. Sitting with good posture should be effortless for a healthy body. Returning to good posture is a gradual process. Many people can work toward  this most comfortably by using various supports until they have the flexibility and strength to maintain this posture on their own. When sitting with proper posture, your ears will fall over your shoulders and your shoulders will fall over your hips. You should use the back of the chair to support your upper back. Your lower back will be in a neutral position, just slightly arched. You may place a small pillow or folded towel at the base of your lower back for  support.  When working at a desk, create an environment that supports good,  upright posture. Without extra support, muscles tire, which leads to excessive strain on joints and other tissues. Keep these recommendations in mind: CHAIR:  A chair should be able to slide under your desk when your back makes contact with the back of the chair. This allows you to work closely.  The chair's height should allow your eyes to be level with the upper part of your monitor and your hands to be slightly lower than your elbows. BODY POSITION  Your feet should make contact with the floor. If this is not possible, use a foot rest.  Keep your ears over your shoulders. This will reduce stress on your neck and low back. INCORRECT SITTING POSTURES  If you are feeling tired and unable to assume a healthy sitting posture, do not slouch or slump. This puts excessive strain on your back tissues, causing more damage and pain. Healthier options include:  Using more support, like a lumbar pillow.  Switching tasks to something that requires you to be upright or walking.  Talking a brief walk.  Lying down to rest in a neutral-spine position. PROLONGED STANDING WHILE SLIGHTLY LEANING FORWARD  When completing a task that requires you to lean forward while standing in one place for a long time, place either foot up on a stationary 2-4 inch high object to help maintain the best posture. When both feet are on the ground, the lower back tends to lose its slight inward curve. If this curve flattens (or becomes too large), then the back and your other joints will experience too much stress, tire more quickly, and can cause pain. CORRECT STANDING POSTURES Proper standing posture should be assumed with all daily activities, even if they only take a few moments, like when brushing your teeth. As in sitting, your ears should fall over your shoulders and your shoulders should fall over your hips. You should keep a slight tension in your abdominal muscles to brace your spine. Your tailbone should point down to  the ground, not behind your body, resulting in an over-extended swayback posture.  INCORRECT STANDING POSTURES  Common incorrect standing postures include a forward head, locked knees and/or an excessive swayback. WALKING Walk with an upright posture. Your ears, shoulders and hips should all line-up. PROLONGED ACTIVITY IN A FLEXED POSITION When completing a task that requires you to bend forward at your waist or lean over a low surface, try to find a way to stabilize 3 out of 4 of your limbs. You can place a hand or elbow on your thigh or rest a knee on the surface you are reaching across. This will provide you more stability, so that your muscles do not tire as quickly. By keeping your knees relaxed, or slightly bent, you will also reduce stress across your lower back. CORRECT LIFTING TECHNIQUES DO :  Assume a wide stance. This will provide you more stability and the opportunity to get as close as  possible to the object which you are lifting.  Tense your abdominals to brace your spine. Bend at the knees and hips. Keeping your back locked in a neutral-spine position, lift using your leg muscles. Lift with your legs, keeping your back straight.  Test the weight of unknown objects before attempting to lift them.  Try to keep your elbows locked down at your sides in order get the best strength from your shoulders when carrying an object.  Always ask for help when lifting heavy or awkward objects. INCORRECT LIFTING TECHNIQUES DO NOT:   Lock your knees when lifting, even if it is a small object.  Bend and twist. Pivot at your feet or move your feet when needing to change directions.  Assume that you can safely pick up even a paperclip without proper posture.   This information is not intended to replace advice given to you by your health care provider. Make sure you discuss any questions you have with your health care provider.   Document Released: 06/06/2005 Document Revised: 06/27/2014  Document Reviewed: 09/18/2008 Elsevier Interactive Patient Education [employer]

## 2015-11-14 LAB — URINALYSIS, MICROSCOPIC ONLY
Bacteria, UA: NONE SEEN [HPF]
Casts: NONE SEEN [LPF]
Crystals: NONE SEEN [HPF]
WBC, UA: NONE SEEN WBC/HPF (ref <=5)
YEAST: NONE SEEN [HPF]

## 2015-11-18 NOTE — Addendum Note (Signed)
Addended by: Deirdre PippinsALEXANDER, Viktor Philipp M on: 11/18/2015 02:10 PM   Modules accepted: Orders

## 2015-11-20 ENCOUNTER — Ambulatory Visit: Payer: Managed Care, Other (non HMO) | Admitting: Osteopathic Medicine

## 2016-01-07 ENCOUNTER — Other Ambulatory Visit: Payer: Self-pay | Admitting: Osteopathic Medicine

## 2016-01-07 ENCOUNTER — Other Ambulatory Visit: Payer: Self-pay

## 2016-01-07 DIAGNOSIS — R319 Hematuria, unspecified: Secondary | ICD-10-CM

## 2016-01-08 LAB — URINALYSIS, MICROSCOPIC ONLY
BACTERIA UA: NONE SEEN [HPF]
Casts: NONE SEEN [LPF]
Crystals: NONE SEEN [HPF]
Squamous Epithelial / LPF: NONE SEEN [HPF] (ref <=5)
WBC UA: NONE SEEN WBC/HPF (ref <=5)
YEAST: NONE SEEN [HPF]

## 2016-04-12 DIAGNOSIS — G43109 Migraine with aura, not intractable, without status migrainosus: Secondary | ICD-10-CM | POA: Insufficient documentation

## 2016-07-15 ENCOUNTER — Other Ambulatory Visit: Payer: Self-pay | Admitting: Obstetrics & Gynecology

## 2016-07-18 NOTE — Telephone Encounter (Signed)
Pt needs to schedule a yearly exam.  1 refill given.

## 2016-07-18 NOTE — Telephone Encounter (Signed)
Pt needs appt for check up before next Rx.

## 2016-10-03 ENCOUNTER — Other Ambulatory Visit: Payer: Self-pay | Admitting: Obstetrics & Gynecology

## 2016-10-10 ENCOUNTER — Ambulatory Visit (INDEPENDENT_AMBULATORY_CARE_PROVIDER_SITE_OTHER): Payer: Managed Care, Other (non HMO) | Admitting: Obstetrics & Gynecology

## 2016-10-10 ENCOUNTER — Encounter: Payer: Self-pay | Admitting: Obstetrics & Gynecology

## 2016-10-10 VITALS — BP 129/85 | HR 78 | Ht 62.0 in | Wt 175.0 lb

## 2016-10-10 DIAGNOSIS — Z01419 Encounter for gynecological examination (general) (routine) without abnormal findings: Secondary | ICD-10-CM | POA: Diagnosis not present

## 2016-10-10 DIAGNOSIS — Z1151 Encounter for screening for human papillomavirus (HPV): Secondary | ICD-10-CM

## 2016-10-10 DIAGNOSIS — Z124 Encounter for screening for malignant neoplasm of cervix: Secondary | ICD-10-CM

## 2016-10-10 DIAGNOSIS — L659 Nonscarring hair loss, unspecified: Secondary | ICD-10-CM

## 2016-10-12 DIAGNOSIS — L659 Nonscarring hair loss, unspecified: Secondary | ICD-10-CM | POA: Insufficient documentation

## 2016-10-12 LAB — CYTOLOGY - PAP
DIAGNOSIS: NEGATIVE
HPV: NOT DETECTED

## 2016-10-12 NOTE — Progress Notes (Signed)
Subjective:     Kara Brady is a 31 y.o. female here for a routine exam.  Current complaints: new diagnosis of hypertension, hair loss.  Personal health questionnaire reviewed: yes.   Gynecologic History Patient's last menstrual period was 09/26/2016. Contraception: OCP (estrogen/progesterone) Last Pap: 2016. Results were: normal (cytology only)   Obstetric History OB History  Gravida Para Term Preterm AB Living  SAB TAB Ectopic Multiple Live Births  1     0 1    # Outcome Date GA Lbr Len/2nd Weight Sex Delivery Anes PTL Lv  3 Preterm 06/04/14 [redacted]w[redacted]d 02:10 / 00:01 4 lb 11.5 oz (2.14 kg) M Vag-Spont None  LIV  2 Term 01/01/10 [redacted]w[redacted]d  6 lb 13 oz (3.09 kg) M Vag-Spont     1 SAB                The following portions of the patient's history were reviewed and updated as appropriate: allergies, current medications, past family history, past medical history, past social history, past surgical history and problem list.  Review of Systems Pertinent items noted in HPI and remainder of comprehensive ROS otherwise negative.    Objective:      Vitals:   10/10/16 1410  BP: 129/85  Pulse: 78  Weight: 175 lb (79.4 kg)  Height:  (1.575 m)   Vitals:  WNL General appearance: alert, cooperative and no distress  HEENT: Normocephalic, without obvious abnormality, atraumatic; Hair loss note especially at temples. Eyes: negative Throat: lips, mucosa, and tongue normal; teeth and gums normal  Respiratory: Clear to auscultation bilaterally  CV: Regular rate and rhythm  Breasts:  no masses or tenderness, no nipple retraction or dimpling.  Large dark macule on left breast; no change per patient but pt should review with dermatologist.  GI: Soft, non-tender; bowel sounds normal; no masses,  no organomegaly  GU: External Genitalia:  Tanner V, no lesion Urethra:  No prolapse   Vagina: Pink, normal rugae, no blood or discharge  Cervix: No CMT, no lesion  Uterus:  Normal size and  contour, non tender  Adnexa: Normal, no masses, non tender  Musculoskeletal: No edema, redness or tenderness in the calves or thighs  Skin: No lesions or rash  Lymphatic: Axillary adenopathy: none     Psychiatric: Normal mood and behavior        Assessment:    Healthy female exam.   Hypertension Hair loss Obesity   Plan:    1.  Pap with cotsting; if nml then pap in 3-5 years 2.  Referral to derm for hair loss and left breast ?Nevus or SK 3.  Weight loss encouraged. 4.  Pt can continue OCPs until 34; will re evaluate if still hypertensive.

## 2016-12-22 ENCOUNTER — Other Ambulatory Visit: Payer: Self-pay | Admitting: Obstetrics & Gynecology

## 2017-07-17 ENCOUNTER — Other Ambulatory Visit: Payer: Self-pay | Admitting: Obstetrics & Gynecology

## 2017-10-11 ENCOUNTER — Encounter: Payer: Self-pay | Admitting: Obstetrics & Gynecology

## 2017-10-11 ENCOUNTER — Ambulatory Visit (INDEPENDENT_AMBULATORY_CARE_PROVIDER_SITE_OTHER): Payer: 59 | Admitting: Obstetrics & Gynecology

## 2017-10-11 VITALS — BP 127/88 | HR 64 | Resp 16 | Ht 62.0 in | Wt 191.0 lb

## 2017-10-11 DIAGNOSIS — Z1151 Encounter for screening for human papillomavirus (HPV): Secondary | ICD-10-CM

## 2017-10-11 DIAGNOSIS — N920 Excessive and frequent menstruation with regular cycle: Secondary | ICD-10-CM

## 2017-10-11 DIAGNOSIS — N923 Ovulation bleeding: Secondary | ICD-10-CM

## 2017-10-11 DIAGNOSIS — Z01419 Encounter for gynecological examination (general) (routine) without abnormal findings: Secondary | ICD-10-CM

## 2017-10-11 DIAGNOSIS — Z124 Encounter for screening for malignant neoplasm of cervix: Secondary | ICD-10-CM | POA: Diagnosis not present

## 2017-10-11 DIAGNOSIS — Z113 Encounter for screening for infections with a predominantly sexual mode of transmission: Secondary | ICD-10-CM | POA: Diagnosis not present

## 2017-10-11 NOTE — Progress Notes (Signed)
Subjective:    Kara Brady is a 32 y.o. married P2 (33 yo son and 198 yo son) female who presents for an annual exam. She reports spotting between periods. She took a Home UPT which was + but was then negative the next day. She is taking OCPs and hasn't missed any The patient is sexually active. GYN screening history: last pap: was normal. The patient wears seatbelts: yes. The patient participates in regular exercise: no. Has the patient ever been transfused or tattooed?: no. The patient reports that there is not domestic violence in her life.   Menstrual History: OB History    Gravida  3   Para  2   Term  1   Preterm  1   AB  1   Living  2     SAB  1   TAB      Ectopic      Multiple  0   Live Births  1           Menarche age: [age] Patient's last menstrual period was 09/22/2017.    The following portions of the patient's history were reviewed and updated as appropriate: allergies, current medications, past family history, past medical history, past social history, past surgical history and problem list.  Review of Systems Pertinent items are noted in HPI.   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 Married for 4 years, denies dyspareunia   Objective:    BP 127/88   Pulse 64   Resp 16   Ht 5\' 2"  (1.575 m)   Wt 191 lb (86.6 kg)   LMP 09/22/2017   BMI 34.93 kg/m   General Appearance:    Alert, cooperative, no distress, appears stated age  Head:    Normocephalic, without obvious abnormality, atraumatic  Eyes:    PERRL, conjunctiva/corneas clear, EOM's intact, fundi    benign, both eyes  Ears:    Normal TM's and external ear canals, both ears  Nose:   Nares normal, septum midline, mucosa normal, no drainage    or sinus tenderness  Throat:   Lips, mucosa, and tongue normal; teeth and gums normal  Neck:   Supple, symmetrical, trachea midline, no adenopathy;    thyroid:  no  enlargement/tenderness/nodules; no carotid   bruit or JVD  Back:     Symmetric, no curvature, ROM normal, no CVA tenderness  Lungs:     Clear to auscultation bilaterally, respirations unlabored  Chest Wall:    No tenderness or deformity   Heart:    Regular rate and rhythm, S1 and S2 normal, no murmur, rub   or gallop  Breast Exam:    No tenderness, masses, or nipple abnormality  Abdomen:     Soft, non-tender, bowel sounds active all four quadrants,    no masses, no organomegaly  Genitalia:    Normal female without lesion, discharge or tenderness, Uterus deviated to the left and about 10 week size, non-tender, mobile     Extremities:   Extremities normal, atraumatic, no cyanosis or edema  Pulses:   2+ and symmetric all extremities  Skin:   Skin color, texture, turgor normal, no rashes or lesions  Lymph nodes:   Cervical, supraclavicular, and axillary nodes normal  Neurologic:   CNII-XII intact, normal strength, sensation and reflexes    throughout  . UPT here negative   Assessment:    Healthy female exam.  Plan:     Thin prep Pap smear. with cotesting Cervical cultures TSH Gyn u/s

## 2017-10-12 LAB — BETA HCG QUANT (REF LAB): hCG Quant: <1 m[IU]/mL

## 2017-10-12 LAB — TSH: TSH: 0.85 mIU/L

## 2017-10-13 ENCOUNTER — Other Ambulatory Visit: Payer: Self-pay | Admitting: Obstetrics & Gynecology

## 2017-10-13 LAB — CYTOLOGY - PAP
Chlamydia: NEGATIVE
Diagnosis: NEGATIVE
HPV: NOT DETECTED
Neisseria Gonorrhea: NEGATIVE

## 2017-10-16 ENCOUNTER — Ambulatory Visit (INDEPENDENT_AMBULATORY_CARE_PROVIDER_SITE_OTHER): Payer: 59

## 2017-10-16 DIAGNOSIS — N923 Ovulation bleeding: Secondary | ICD-10-CM | POA: Diagnosis not present

## 2017-10-19 ENCOUNTER — Telehealth: Payer: Self-pay

## 2017-10-19 DIAGNOSIS — IMO0001 Reserved for inherently not codable concepts without codable children: Secondary | ICD-10-CM

## 2017-10-19 MED ORDER — NORGESTIMATE-ETH ESTRADIOL 0.25-35 MG-MCG PO TABS: 1.0000 | ORAL_TABLET | Freq: Every day | ORAL | 2 refills | Status: DC

## 2017-10-19 NOTE — Telephone Encounter (Signed)
Pharmacy called for pt's refill of OCP. Pt is up to date with annual. Rx sent.

## 2017-10-25 ENCOUNTER — Ambulatory Visit (INDEPENDENT_AMBULATORY_CARE_PROVIDER_SITE_OTHER): Payer: 59 | Admitting: Obstetrics & Gynecology

## 2017-10-25 ENCOUNTER — Encounter: Payer: Self-pay | Admitting: Obstetrics & Gynecology

## 2017-10-25 VITALS — BP 123/86 | HR 69 | Ht 62.0 in | Wt 189.0 lb

## 2017-10-25 DIAGNOSIS — N938 Other specified abnormal uterine and vaginal bleeding: Secondary | ICD-10-CM | POA: Diagnosis not present

## 2017-10-25 MED ORDER — NORGESTREL-ETHINYL ESTRADIOL 0.3-30 MG-MCG PO TABS: 1.0000 | ORAL_TABLET | Freq: Every day | ORAL | 11 refills | Status: DC

## 2017-10-25 NOTE — Progress Notes (Signed)
   Subjective:    Patient ID: Kara Brady, female    DOB: Aug 29, 1985, 32 y.o.   MRN: [id number]  HPI 32 yo married P2 here to discuss her irregular bleeding. She is taking OCPs but is still bleeding   Review of Systems     Objective:   Physical Exam  Breathing, conversing, and ambulating normally Well nourished, well hydrated Latina, no apparent distress     Assessment & Plan:  DUB- u/s c/w adenomyosis but she still wants another baby in the future so no ablation or hysterectomy. I will check TSH, CBC, and change her OCPs to lo ovral

## 2017-10-26 LAB — CBC
HCT: 39.6 % (ref 35.0–45.0)
Hemoglobin: 13.4 g/dL (ref 11.7–15.5)
MCH: 29.4 pg (ref 27.0–33.0)
MCHC: 33.8 g/dL (ref 32.0–36.0)
MCV: 86.8 fL (ref 80.0–100.0)
MPV: 9.9 fL (ref 7.5–12.5)
PLATELETS: 348 10*3/uL (ref 140–400)
RBC: 4.56 10*6/uL (ref 3.80–5.10)
RDW: 13.2 % (ref 11.0–15.0)
WBC: 7.9 10*3/uL (ref 3.8–10.8)

## 2017-10-26 LAB — TSH: TSH: 0.82 mIU/L

## 2018-09-07 ENCOUNTER — Other Ambulatory Visit: Payer: Self-pay | Admitting: Obstetrics & Gynecology

## 2018-12-06 ENCOUNTER — Other Ambulatory Visit: Payer: Self-pay | Admitting: Obstetrics & Gynecology

## 2018-12-10 ENCOUNTER — Telehealth: Payer: Self-pay | Admitting: *Deleted

## 2018-12-10 NOTE — Telephone Encounter (Signed)
Fax request from pharmacy for refill on Cryselle-28 tabs.   Do you want to approve refill prior to pt follow up appt. If refill approved, please send to pharmacy.

## 2018-12-11 ENCOUNTER — Other Ambulatory Visit: Payer: Self-pay | Admitting: *Deleted

## 2018-12-11 ENCOUNTER — Other Ambulatory Visit: Payer: Self-pay | Admitting: Obstetrics & Gynecology

## 2018-12-11 MED ORDER — CRYSELLE-28 0.3-30 MG-MCG PO TABS: 1.0000 | ORAL_TABLET | Freq: Every day | ORAL | 6 refills | Status: DC

## 2018-12-11 NOTE — Progress Notes (Signed)
OCP's refilled.

## 2020-03-05 ENCOUNTER — Ambulatory Visit: Payer: 59 | Admitting: Obstetrics and Gynecology

## 2020-04-09 ENCOUNTER — Encounter: Payer: Self-pay | Admitting: Obstetrics and Gynecology

## 2020-04-09 ENCOUNTER — Other Ambulatory Visit: Payer: Self-pay

## 2020-04-09 ENCOUNTER — Ambulatory Visit (INDEPENDENT_AMBULATORY_CARE_PROVIDER_SITE_OTHER): Payer: 59 | Admitting: Obstetrics and Gynecology

## 2020-04-09 VITALS — BP 137/94 | HR 58 | Ht 62.0 in | Wt 181.0 lb

## 2020-04-09 DIAGNOSIS — Z3189 Encounter for other procreative management: Secondary | ICD-10-CM | POA: Diagnosis not present

## 2020-04-09 NOTE — Progress Notes (Signed)
Pt has been trying to get pregnant since Feb 2021

## 2020-04-09 NOTE — Progress Notes (Signed)
° °  GYNECOLOGY OFFICE NOTE  History:  34 y.o. [id number] here today for trying to achieve pregnancy. She is having regular menstrual cycles lasting 4-5 days, between 28-33 days. They are using an app on her phone to see when she is ovulating and should have intercourse.   Past Medical History:  Diagnosis Date   Headache    History of migraine headaches    Hypertension     Past Surgical History:  Procedure Laterality Date   BREAST BIOPSY       Current Outpatient Medications:    labetalol (NORMODYNE) 200 MG tablet, Take 200 mg by mouth 2 (two) times daily., Disp: , Rfl:    lisinopril-hydrochlorothiazide (PRINZIDE,ZESTORETIC) 10-12.5 MG tablet, Take 1 tablet by mouth daily., Disp: , Rfl: 1  The following portions of the patient's history were reviewed and updated as appropriate: allergies, current medications, past family history, past medical history, past social history, past surgical history and problem list.   Review of Systems:  Pertinent items noted in HPI and remainder of comprehensive ROS otherwise negative.   Objective:  Physical Exam BP (!) 137/94    Pulse (!) 58    Ht 5\' 2"  (1.575 m)    Wt 181 lb (82.1 kg)    LMP 03/15/2020    BMI 33.11 kg/m  CONSTITUTIONAL: Well-developed, well-nourished female in no acute distress.  HENT:  Normocephalic, atraumatic. External right and left ear normal. Oropharynx is clear and moist EYES: Conjunctivae and EOM are normal. Pupils are equal, round, and reactive to light. No scleral icterus.  NECK: Normal range of motion, supple, no masses SKIN: Skin is warm and dry. No rash noted. Not diaphoretic. No erythema. No pallor. NEUROLOGIC: Alert and oriented to person, place, and time. Normal reflexes, muscle tone coordination. No cranial nerve deficit noted. PSYCHIATRIC: Normal mood and affect. Normal behavior. Normal judgment and thought content. CARDIOVASCULAR: Normal heart rate noted RESPIRATORY: Effort normal, no problems with respiration  noted ABDOMEN: Soft, no distention noted.   PELVIC: deferred MUSCULOSKELETAL: Normal range of motion. No edema noted.  Labs and Imaging No results found.  Assessment & Plan:   1. Encounter for fertility planning - Recommend timed intercourse using ovulation predictor kits, versus intercourse every other day.  - Reviewed with slightly irregular period cycles, she may be ovulating different days and apps will only tell her when day 14 is, that she needs to optimize intercourse based on kits - reviewed recommendation to seek fertility assistance if > 6 months trying to conceive with no success given her age, reviewed option for referral now for REI versus waiting a few months to see if she has success with ovulation predictor kits, she would like to go ahead and get referral now so that she has appointment, will cancel if needs to - call if not able to demonstrate ovulation - has two other children with same partner, suspect no acute issues for infertility, likely timing of intercourse issue due to irregular cycles - Ambulatory referral to Infertility   Routine preventative health maintenance measures emphasized. Please refer to After Visit Summary for other counseling recommendations.   Return if symptoms worsen or fail to improve.  Total face-to-face time with patient: 25 minutes. Over 50% of encounter was spent on counseling and coordination of care.  03/17/2020, M.D. Attending [facility] for [facility] [employer])

## 2020-04-09 NOTE — Patient Instructions (Signed)
Use Ovulation predictor kits.  ClearBlue ovulation kits Pregmate ovulation kits

## 2020-06-20 NOTE — L&D Delivery Note (Addendum)
OB/GYN Faculty Practice Delivery Note  COOKIE PORE is a 35 y.o. [id number] s/p PPD#0 SVD at [redacted]w[redacted]d. She was admitted for IOL-cHTN w/ severe SIPE (BP, floaters, transaminitis>early HELLP).   ROM: 0h 54m with clear fluid GBS Status: neg Maximum Maternal Temperature: 98.2  Labor Progress: Pit started at 2342 and AROM@0745 -clear fluid. Pt progressed quickly after this and delivered.   Delivery Date/Time: [phone] Delivery: Called to room and patient was complete and pushing. Head delivered LOA. RN delivered the baby as I was getting gloves. No nuchal cord present. Shoulder and body delivered in usual fashion. Infant with spontaneous cry, placed on mother's abdomen, dried and stimulated. Cord clamped x 2 after 1-minute delay, and cut by FOB under direct supervision. Cord blood drawn. Placenta delivered spontaneously with gentle cord traction. Fundus firm with massage and Pitocin. Labia, perineum, vagina, and cervix were inspected, 1st degree perineal tear. Pt was still oozing, gave TXA in addition to pitocin postpartum.   Placenta: complete, three vessel cord appreciated Complications: Severe PreE with SIPE-on mag and labetolol Lacerations: 1st degree perineal tear  EBL: 125 mL Analgesia: None  Postpartum Planning [x]  message to sent to schedule follow-up  [x]  vaccines UTD  Infant: Healthy and viable infant  APGARs 8, 9   Suyash Amory , MD Center for , [facility]

## 2020-07-24 ENCOUNTER — Encounter: Payer: 59 | Admitting: Certified Nurse Midwife

## 2020-07-28 ENCOUNTER — Other Ambulatory Visit (HOSPITAL_COMMUNITY)
Admission: RE | Admit: 2020-07-28 | Discharge: 2020-07-28 | Disposition: A | Discharge status: Home / Self Care | Payer: 59 | Source: Ambulatory Visit | Attending: Certified Nurse Midwife | Admitting: Certified Nurse Midwife

## 2020-07-28 ENCOUNTER — Encounter: Payer: Self-pay | Admitting: Certified Nurse Midwife

## 2020-07-28 ENCOUNTER — Ambulatory Visit (INDEPENDENT_AMBULATORY_CARE_PROVIDER_SITE_OTHER): Payer: 59 | Admitting: Certified Nurse Midwife

## 2020-07-28 VITALS — BP 115/70 | HR 89 | Wt 181.0 lb

## 2020-07-28 DIAGNOSIS — O0990 Supervision of high risk pregnancy, unspecified, unspecified trimester: Secondary | ICD-10-CM | POA: Insufficient documentation

## 2020-07-28 DIAGNOSIS — O10911 Unspecified pre-existing hypertension complicating pregnancy, first trimester: Secondary | ICD-10-CM

## 2020-07-28 DIAGNOSIS — O0991 Supervision of high risk pregnancy, unspecified, first trimester: Secondary | ICD-10-CM | POA: Diagnosis not present

## 2020-07-28 DIAGNOSIS — Z3A10 10 weeks gestation of pregnancy: Secondary | ICD-10-CM

## 2020-07-28 DIAGNOSIS — O10919 Unspecified pre-existing hypertension complicating pregnancy, unspecified trimester: Secondary | ICD-10-CM

## 2020-07-28 MED ORDER — CITRANATAL ASSURE 35-1 & 300 MG PO MISC: 1.0000 | Freq: Every day | ORAL | 3 refills | Status: DC

## 2020-07-28 MED ORDER — ASPIRIN EC 81 MG PO TBEC: 81.0000 mg | DELAYED_RELEASE_TABLET | Freq: Every day | ORAL | 0 refills | Status: DC

## 2020-07-28 NOTE — Patient Instructions (Signed)
https://www.cdc.gov/pregnancy/infections.html">  First Trimester of Pregnancy  The first trimester of pregnancy starts on the first day of your last menstrual period until the end of week 12. This is also called months 1 through 3 of pregnancy. Body changes during your first trimester Your body goes through many changes during pregnancy. The changes usually return to normal after your baby is born. Physical changes  You may gain or lose weight.  Your breasts may grow larger and hurt. The area around your nipples may get darker.  Dark spots or blotches may develop on your face.  You may have changes in your hair. Health changes  You may feel like you might vomit (nauseous), and you may vomit.  You may have heartburn.  You may have headaches.  You may have trouble pooping (constipation).  Your gums may bleed. Other changes  You may get tired easily.  You may pee (urinate) more often.  Your menstrual periods will stop.  You may not feel hungry.  You may want to eat certain kinds of food.  You may have changes in your emotions from day to day.  You may have more dreams. Follow these instructions at home: Medicines  Take over-the-counter and prescription medicines only as told by your doctor. Some medicines are not safe during pregnancy.  Take a prenatal vitamin that contains at least 600 micrograms (mcg) of folic acid. Eating and drinking  Eat healthy meals that include: ? Fresh fruits and vegetables. ? Whole grains. ? Good sources of protein, such as meat, eggs, or tofu. ? Low-fat dairy products.  Avoid raw meat and unpasteurized juice, milk, and cheese.  If you feel like you may vomit, or you vomit: ? Eat 4 or 5 small meals a day instead of 3 large meals. ? Try eating a few soda crackers. ? Drink liquids between meals instead of during meals.  You may need to take these actions to prevent or treat trouble pooping: ? Drink enough fluids to keep your pee  (urine) pale yellow. ? Eat foods that are high in fiber. These include beans, whole grains, and fresh fruits and vegetables. ? Limit foods that are high in fat and sugar. These include fried or sweet foods. Activity  Exercise only as told by your doctor. Most people can do their usual exercise routine during pregnancy.  Stop exercising if you have cramps or pain in your lower belly (abdomen) or low back.  Do not exercise if it is too hot or too humid, or if you are in a place of great height (high altitude).  Avoid heavy lifting.  If you choose to, you may have sex unless your doctor tells you not to. Relieving pain and discomfort  Wear a good support bra if your breasts are sore.  Rest with your legs raised (elevated) if you have leg cramps or low back pain.  If you have bulging veins (varicose veins) in your legs: ? Wear support hose as told by your doctor. ? Raise your feet for 15 minutes, 3-4 times a day. ? Limit salt in your food. Safety  Wear your seat belt at all times when you are in a car.  Talk with your doctor if someone is hurting you or yelling at you.  Talk with your doctor if you are feeling sad or have thoughts of hurting yourself. Lifestyle  Do not use hot tubs, steam rooms, or saunas.  Do not douche. Do not use tampons or scented sanitary pads.  Do not   use herbal medicines, illegal drugs, or medicines that are not approved by your doctor. Do not drink alcohol.  Do not smoke or use any products that contain nicotine or tobacco. If you need help quitting, ask your doctor.  Avoid cat litter boxes and soil that is used by cats. These carry germs that can cause harm to the baby and can cause a loss of your baby by miscarriage or stillbirth. General instructions  Keep all follow-up visits. This is important.  Ask for help if you need counseling or if you need help with nutrition. Your doctor can give you advice or tell you where to go for help.  Visit your  dentist. At home, brush your teeth with a soft toothbrush. Floss gently.  Write down your questions. Take them to your prenatal visits. Where to find more information  American Pregnancy Association: americanpregnancy.org  American College of Obstetricians and Gynecologists: www.acog.org  Office on Women's Health: womenshealth.gov/pregnancy Contact a doctor if:  You are dizzy.  You have a fever.  You have mild cramps or pressure in your lower belly.  You have a nagging pain in your belly area.  You continue to feel like you may vomit, you vomit, or you have watery poop (diarrhea) for 24 hours or longer.  You have a bad-smelling fluid coming from your vagina.  You have pain when you pee.  You are exposed to a disease that spreads from person to person, such as chickenpox, measles, Zika virus, HIV, or hepatitis. Get help right away if:  You have spotting or bleeding from your vagina.  You have very bad belly cramping or pain.  You have shortness of breath or chest pain.  You have any kind of injury, such as from a fall or a car crash.  You have new or increased pain, swelling, or redness in an arm or leg. Summary  The first trimester of pregnancy starts on the first day of your last menstrual period until the end of week 12 (months 1 through 3).  Eat 4 or 5 small meals a day instead of 3 large meals.  Do not smoke or use any products that contain nicotine or tobacco. If you need help quitting, ask your doctor.  Keep all follow-up visits. This information is not intended to replace advice given to you by your health care provider. Make sure you discuss any questions you have with your health care provider. Document Revised: 11/13/2019 Document Reviewed: 09/19/2019 Elsevier Patient Education  2021 [employer]  

## 2020-07-28 NOTE — Progress Notes (Signed)
History:   Kara Brady is a 35 y.o. [id number] at [redacted]w[redacted]d by LMP being seen today for her first obstetrical visit.  Her obstetrical history is significant for advanced maternal age, pre-eclampsia and chronic hypertension. Patient does intend to breast feed. Pregnancy history fully reviewed.  Patient reports nausea.     HISTORY: OB History  Gravida Para Term Preterm AB Living  4 2 1 1 1 2   SAB IAB Ectopic Multiple Live Births  1 0 0 0 1    # Outcome Date GA Lbr Len/2nd Weight Sex Delivery Anes PTL Lv  4 Current           3 Preterm 06/04/14 [redacted]w[redacted]d 02:10 / 00:01 4 lb 11.5 oz (2.14 kg) M Vag-Spont None  LIV     Apgar1: 9  Apgar5: 9  2 Term 01/01/10 [redacted]w[redacted]d  6 lb 13 oz (3.09 kg) M Vag-Spont     1 SAB             Last pap smear was done 09/2017 and was normal  Past Medical History:  Diagnosis Date  . Headache   . History of migraine headaches   . Hypertension    Past Surgical History:  Procedure Laterality Date  . BREAST BIOPSY     Family History  Problem Relation Age of Onset  . Hypertension Mother   . Diabetes Paternal Grandfather    Social History   Tobacco Use  . Smoking status: Never Smoker  . Smokeless tobacco: Never Used  Substance Use Topics  . Alcohol use: No  . Drug use: No   No Known Allergies Current Outpatient Medications on File Prior to Visit  Medication Sig Dispense Refill  . labetalol (NORMODYNE) 200 MG tablet Take 200 mg by mouth 2 (two) times daily.    10/2017 lisinopril-hydrochlorothiazide (PRINZIDE,ZESTORETIC) 10-12.5 MG tablet Take 1 tablet by mouth daily.  1   No current facility-administered medications on file prior to visit.    Review of Systems Pertinent items noted in HPI and remainder of comprehensive ROS otherwise negative.  Physical Exam:   Vitals:   07/28/20 0804  BP: 115/70  Pulse: 89  Weight: 181 lb (82.1 kg)   Fetal Heart Rate (bpm): 163  General: well-developed, well-nourished female in no acute distress  Breasts:  normal  appearance, no masses or tenderness bilaterally  Skin: normal coloration and turgor, no rashes  Neurologic: oriented, normal, negative, normal mood  Extremities: normal strength, tone, and muscle mass, ROM of all joints is normal  HEENT PERRLA, extraocular movement intact and sclera clear  Neck supple and no masses  Cardiovascular: regular rate and rhythm  Respiratory:  no respiratory distress, normal breath sounds  Abdomen: soft, non-tender; bowel sounds normal; no masses,  no organomegaly  Pelvic: normal external genitalia, no lesions, pap smear not done at appointment today     Assessment:    Pregnancy: 09/25/20 Patient Active Problem List   Diagnosis Date Noted  . Supervision of high risk pregnancy, antepartum 07/28/2020  . Hair loss 10/12/2016  . Migraine aura without headache 04/12/2016  . Migraine without aura 03/25/2014  . Low grade squamous intraepithelial lesion (LGSIL) on Papanicolaou smear of cervix 02/05/2014  . Clinical depression 11/14/2012  . Anxiety 10/17/2012  . 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 Supervision of high risk pregnancy, antepartum - Welcomed back to practice and introduced self to patient  - Reviewed safety, visitor policy, reassurance about COVID-19 for pregnancy at this  time. Discussed possible changes to visits, including televisits, that may occur due to COVID-19.  The office remains open if pt needs to be seen and MAU is open 24 hours/day for OB emergencies. - Babyscripts Schedule Optimization - Culture, OB Urine - HIV antibody (with reflex) - Hepatitis C Antibody - Hemoglobinopathy Evaluation - [place] bedside; Future - HgB A1c - aspirin EC 81 MG tablet; Take 1 tablet (81 mg total) by mouth daily. Take after 12 weeks for prevention of preeclampsia later in pregnancy  Dispense: 300 tablet; Refill: 0 - Prenat w/o A-FeCbGl-DSS-FA-DHA (CITRANATAL ASSURE) 35-1 & 300 MG tablet; Take 1 tablet by mouth daily.  Dispense: 90 each; Refill: 3 -  Cervicovaginal ancillary only( [place])  2. Chronic hypertension during pregnancy, antepartum - Patient currently taking 200mg  Labetalol BID - continue to take as prescribed  - educated and discussed importance of starting bASA at 12 weeks, patient verbalizes understanding  - aspirin EC 81 MG tablet; Take 1 tablet (81 mg total) by mouth daily. Take after 12 weeks for prevention of preeclampsia later in pregnancy  Dispense: 300 tablet; Refill: 0  3. [redacted] weeks gestation of pregnancy - patient will return in 1 week for genetic screening    Initial labs drawn. Continue prenatal vitamins. Problem list reviewed and updated. Genetic Screening discussed, NIPS: requested. Ultrasound discussed; fetal anatomic survey: requested. Anticipatory guidance about prenatal visits given including labs, ultrasounds, and testing. Discussed usage of Babyscripts and virtual visits as additional source of managing and completing prenatal visits in midst of coronavirus and pandemic.   Encouraged to complete MyChart Registration for her ability to review results, send requests, and have questions addressed.  The nature of [facility] with multiple MDs and Advanced Practice Providers was explained to patient; also emphasized that residents, students are part of our team. Routine obstetric precautions reviewed. Encouraged to seek out care at office or emergency room [facility] preferred) for urgent and/or emergent concerns. Return in about 4 weeks (around 08/25/2020) for HROB, in person.     10/25/2020, CNM [facility], [facility]

## 2020-07-28 NOTE — Progress Notes (Signed)
Bedside U/S shows FHT of 163 BPM and CRL 37.61mm  GA [redacted]w[redacted]d

## 2020-07-29 LAB — CERVICOVAGINAL ANCILLARY ONLY
Chlamydia: NEGATIVE
Comment: NEGATIVE
Comment: NORMAL
Neisseria Gonorrhea: NEGATIVE

## 2020-07-30 LAB — HEMOGLOBIN A1C
Hgb A1c MFr Bld: 5.3 % of total Hgb (ref <5.7)
Mean Plasma Glucose: 105 mg/dL
eAG (mmol/L): 5.8 mmol/L

## 2020-07-30 LAB — HEMOGLOBINOPATHY EVALUATION
Fetal Hemoglobin Testing: <1 % (ref 0.0–1.9)
HCT: 38.6 % (ref 35.0–45.0)
Hemoglobin A2 - HGBRFX: 2.5 % (ref 2.2–3.2)
Hemoglobin: 12.8 g/dL (ref 11.7–15.5)
Hgb A: 97.5 % (ref >96.0)
MCH: 31.1 pg (ref 27.0–33.0)
MCV: 93.7 fL (ref 80.0–100.0)
RBC: 4.12 10*6/uL (ref 3.80–5.10)
RDW: 12.7 % (ref 11.0–15.0)

## 2020-07-30 LAB — CULTURE, OB URINE

## 2020-07-30 LAB — HEPATITIS C ANTIBODY
Hepatitis C Ab: NONREACTIVE
SIGNAL TO CUT-OFF: 0.01 (ref <1.00)

## 2020-07-30 LAB — URINE CULTURE, OB REFLEX: Organism ID, Bacteria: NO GROWTH

## 2020-07-30 LAB — HIV ANTIBODY (ROUTINE TESTING W REFLEX): HIV 1&2 Ab, 4th Generation: NONREACTIVE

## 2020-08-05 ENCOUNTER — Other Ambulatory Visit: Payer: Self-pay

## 2020-08-05 ENCOUNTER — Ambulatory Visit (INDEPENDENT_AMBULATORY_CARE_PROVIDER_SITE_OTHER): Payer: 59

## 2020-08-05 DIAGNOSIS — Z3A11 11 weeks gestation of pregnancy: Secondary | ICD-10-CM

## 2020-08-05 DIAGNOSIS — Z3480 Encounter for supervision of other normal pregnancy, unspecified trimester: Secondary | ICD-10-CM

## 2020-08-05 DIAGNOSIS — Z3481 Encounter for supervision of other normal pregnancy, first trimester: Secondary | ICD-10-CM

## 2020-08-05 NOTE — Progress Notes (Signed)
NIPS drawn.

## 2020-08-13 DIAGNOSIS — O0990 Supervision of high risk pregnancy, unspecified, unspecified trimester: Secondary | ICD-10-CM

## 2020-08-24 ENCOUNTER — Encounter: Payer: Self-pay | Admitting: *Deleted

## 2020-08-24 DIAGNOSIS — O0990 Supervision of high risk pregnancy, unspecified, unspecified trimester: Secondary | ICD-10-CM

## 2020-08-27 ENCOUNTER — Ambulatory Visit (INDEPENDENT_AMBULATORY_CARE_PROVIDER_SITE_OTHER): Payer: 59 | Admitting: Obstetrics and Gynecology

## 2020-08-27 ENCOUNTER — Encounter: Payer: Self-pay | Admitting: Obstetrics and Gynecology

## 2020-08-27 ENCOUNTER — Other Ambulatory Visit: Payer: Self-pay

## 2020-08-27 VITALS — BP 120/73 | HR 77 | Wt 186.0 lb

## 2020-08-27 DIAGNOSIS — O0990 Supervision of high risk pregnancy, unspecified, unspecified trimester: Secondary | ICD-10-CM

## 2020-08-27 DIAGNOSIS — O10919 Unspecified pre-existing hypertension complicating pregnancy, unspecified trimester: Secondary | ICD-10-CM | POA: Insufficient documentation

## 2020-08-27 DIAGNOSIS — O09899 Supervision of other high risk pregnancies, unspecified trimester: Secondary | ICD-10-CM

## 2020-08-27 DIAGNOSIS — Z283 Underimmunization status: Secondary | ICD-10-CM

## 2020-08-27 DIAGNOSIS — Z8751 Personal history of pre-term labor: Secondary | ICD-10-CM

## 2020-08-27 DIAGNOSIS — O99891 Other specified diseases and conditions complicating pregnancy: Secondary | ICD-10-CM

## 2020-08-27 DIAGNOSIS — O09299 Supervision of pregnancy with other poor reproductive or obstetric history, unspecified trimester: Secondary | ICD-10-CM

## 2020-08-27 NOTE — Progress Notes (Signed)
   PRENATAL VISIT NOTE  Subjective:  Kara Brady is a 35 y.o. [phone] at [redacted]w[redacted]d being seen today for ongoing prenatal care.  She is currently monitored for the following issues for this high-risk pregnancy and has Low grade squamous intraepithelial lesion (LGSIL) on Papanicolaou smear of cervix; Bloodgood disease; Clinical depression; Anxiety; Migraine without aura; Migraine aura without headache; Hair loss; Supervision of high risk pregnancy, antepartum; Chronic hypertension affecting pregnancy; Hx of preeclampsia, prior pregnancy, currently pregnant; and History of preterm delivery on their problem list.  Patient reports no complaints.  Contractions: Not present. Vag. Bleeding: None.  Movement: Present. Denies leaking of fluid.   The following portions of the patient's history were reviewed and updated as appropriate: allergies, current medications, past family history, past medical history, past social history, past surgical history and problem list.   Objective:   Vitals:   08/27/20 0907  BP: 120/73  Pulse: 77  Weight: 186 lb (84.4 kg)    Fetal Status: Fetal Heart Rate (bpm): 154   Movement: Present     General:  Alert, oriented and cooperative. Patient is in no acute distress.  Skin: Skin is warm and dry. No rash noted.   Cardiovascular: Normal heart rate noted  Respiratory: Normal respiratory effort, no problems with respiration noted  Abdomen: Soft, gravid, appropriate for gestational age.  Pain/Pressure: Absent     Pelvic: Cervical exam deferred        Extremities: Normal range of motion.  Edema: None  Mental Status: Normal mood and affect. Normal behavior. Normal judgment and thought content.   Assessment and Plan:  Pregnancy: [id number] at [redacted]w[redacted]d 1. Supervision of high risk pregnancy, antepartum Patient is doing well without complaints AFP today Prenatal and baseline labs ordered Anatomy ultrasound ordered - Obstetric panel - Alpha fetoprotein, maternal - [place] MFM OB DETAIL  +14 WK; Future  2. Chronic hypertension affecting pregnancy Normotensive Continue ASA and labetalol  3. Hx of preeclampsia, prior pregnancy, currently pregnant   4. History of preterm delivery   Preterm labor symptoms and general obstetric precautions including but not limited to vaginal bleeding, contractions, leaking of fluid and fetal movement were reviewed in detail with the patient. Please refer to After Visit Summary for other counseling recommendations.   Return in about 4 weeks (around 09/24/2020) for in person, ROB, High risk.  No future appointments.  Catalina Antigua, MD

## 2020-08-28 LAB — COMPREHENSIVE METABOLIC PANEL
AG Ratio: 1.4 (calc) (ref 1.0–2.5)
ALT: 21 U/L (ref 6–29)
AST: 16 U/L (ref 10–30)
Albumin: 3.6 g/dL (ref 3.6–5.1)
Alkaline phosphatase (APISO): 38 U/L (ref 31–125)
BUN/Creatinine Ratio: 22 (calc) (ref 6–22)
BUN: 11 mg/dL (ref 7–25)
CO2: 24 mmol/L (ref 20–32)
Calcium: 9.4 mg/dL (ref 8.6–10.2)
Chloride: 104 mmol/L (ref 98–110)
Creat: 0.49 mg/dL — ABNORMAL LOW (ref 0.50–1.10)
Globulin: 2.6 g/dL (calc) (ref 1.9–3.7)
Glucose, Bld: 78 mg/dL (ref 65–139)
Potassium: 4.1 mmol/L (ref 3.5–5.3)
Sodium: 137 mmol/L (ref 135–146)
Total Bilirubin: 0.3 mg/dL (ref 0.2–1.2)
Total Protein: 6.2 g/dL (ref 6.1–8.1)

## 2020-08-28 LAB — PROTEIN / CREATININE RATIO, URINE
Creatinine, Urine: 35 mg/dL (ref 20–275)
Protein/Creat Ratio: 114 mg/g creat (ref 21–161)
Protein/Creatinine Ratio: 0.114 mg/mg creat (ref 0.021–0.16)
Total Protein, Urine: 4 mg/dL — ABNORMAL LOW (ref 5–24)

## 2020-09-04 LAB — OBSTETRIC PANEL
Absolute Monocytes: 624 cells/uL (ref 200–950)
Antibody Screen: NOT DETECTED
Basophils Absolute: 29 cells/uL (ref 0–200)
Basophils Relative: 0.3 %
Eosinophils Absolute: 29 cells/uL (ref 15–500)
Eosinophils Relative: 0.3 %
HCT: 35.6 % (ref 35.0–45.0)
Hemoglobin: 12.1 g/dL (ref 11.7–15.5)
Hepatitis B Surface Ag: NONREACTIVE
Lymphs Abs: 2851 cells/uL (ref 850–3900)
MCH: 30.9 pg (ref 27.0–33.0)
MCHC: 34 g/dL (ref 32.0–36.0)
MCV: 90.8 fL (ref 80.0–100.0)
MPV: 10.8 fL (ref 7.5–12.5)
Monocytes Relative: 6.5 %
Neutro Abs: 6067 cells/uL (ref 1500–7800)
Neutrophils Relative %: 63.2 %
Platelets: 271 10*3/uL (ref 140–400)
RBC: 3.92 10*6/uL (ref 3.80–5.10)
RDW: 12.5 % (ref 11.0–15.0)
RPR Ser Ql: NONREACTIVE
Rubella: 0.99 Index — ABNORMAL LOW
Total Lymphocyte: 29.7 %
WBC: 9.6 10*3/uL (ref 3.8–10.8)

## 2020-09-04 LAB — TIQ-MISC

## 2020-09-04 LAB — ALPHA FETOPROTEIN, MATERNAL

## 2020-09-07 DIAGNOSIS — Z2839 Other underimmunization status: Secondary | ICD-10-CM | POA: Insufficient documentation

## 2020-09-07 DIAGNOSIS — O09899 Supervision of other high risk pregnancies, unspecified trimester: Secondary | ICD-10-CM | POA: Insufficient documentation

## 2020-09-07 DIAGNOSIS — O99891 Other specified diseases and conditions complicating pregnancy: Secondary | ICD-10-CM | POA: Insufficient documentation

## 2020-09-23 ENCOUNTER — Ambulatory Visit: Payer: 59 | Attending: Obstetrics and Gynecology

## 2020-09-23 ENCOUNTER — Other Ambulatory Visit: Payer: Self-pay | Admitting: *Deleted

## 2020-09-23 ENCOUNTER — Ambulatory Visit: Payer: 59 | Admitting: *Deleted

## 2020-09-23 ENCOUNTER — Other Ambulatory Visit: Payer: Self-pay

## 2020-09-23 ENCOUNTER — Encounter: Payer: Self-pay | Admitting: *Deleted

## 2020-09-23 DIAGNOSIS — O10912 Unspecified pre-existing hypertension complicating pregnancy, second trimester: Secondary | ICD-10-CM

## 2020-09-23 DIAGNOSIS — O10919 Unspecified pre-existing hypertension complicating pregnancy, unspecified trimester: Secondary | ICD-10-CM

## 2020-09-23 DIAGNOSIS — Z8751 Personal history of pre-term labor: Secondary | ICD-10-CM

## 2020-09-23 DIAGNOSIS — O09299 Supervision of pregnancy with other poor reproductive or obstetric history, unspecified trimester: Secondary | ICD-10-CM

## 2020-09-23 DIAGNOSIS — Z2839 Other underimmunization status: Secondary | ICD-10-CM | POA: Diagnosis present

## 2020-09-23 DIAGNOSIS — O0990 Supervision of high risk pregnancy, unspecified, unspecified trimester: Secondary | ICD-10-CM

## 2020-09-24 ENCOUNTER — Ambulatory Visit (INDEPENDENT_AMBULATORY_CARE_PROVIDER_SITE_OTHER): Payer: 59 | Admitting: Obstetrics & Gynecology

## 2020-09-24 VITALS — BP 123/81 | HR 75 | Wt 188.0 lb

## 2020-09-24 DIAGNOSIS — Z3A19 19 weeks gestation of pregnancy: Secondary | ICD-10-CM

## 2020-09-24 DIAGNOSIS — O0990 Supervision of high risk pregnancy, unspecified, unspecified trimester: Secondary | ICD-10-CM

## 2020-09-24 DIAGNOSIS — O10919 Unspecified pre-existing hypertension complicating pregnancy, unspecified trimester: Secondary | ICD-10-CM

## 2020-09-24 NOTE — Patient Instructions (Signed)

## 2020-09-24 NOTE — Progress Notes (Signed)
PRENATAL VISIT NOTE  Subjective:  Kara Brady is a 35 y.o. [phone] at 6937w1d being seen today for ongoing prenatal care.  She is currently monitored for the following issues for this high-risk pregnancy and has Low grade squamous intraepithelial lesion (LGSIL) on Papanicolaou smear of cervix; Bloodgood disease; Clinical depression; Anxiety; Migraine without aura; Migraine aura without headache; Hair loss; Supervision of high risk pregnancy, antepartum; Chronic hypertension affecting pregnancy; Hx of preeclampsia, prior pregnancy, currently pregnant; History of preterm delivery; and Rubella non-immune status, antepartum on their problem list.  Patient reports no complaints.  Contractions: Not present. Vag. Bleeding: None.  Movement: Present. Denies leaking of fluid.   The following portions of the patient's history were reviewed and updated as appropriate: allergies, current medications, past family history, past medical history, past social history, past surgical history and problem list.   Objective:   Vitals:   09/24/20 0844  BP: 123/81  Pulse: 75  Weight: 188 lb (85.3 kg)    Fetal Status: Fetal Heart Rate (bpm): 155   Movement: Present     General:  Alert, oriented and cooperative. Patient is in no acute distress.  Skin: Skin is warm and dry. No rash noted.   Cardiovascular: Normal heart rate noted  Respiratory: Normal respiratory effort, no problems with respiration noted  Abdomen: Soft, gravid, appropriate for gestational age.  Pain/Pressure: Absent     Pelvic: Cervical exam deferred        Extremities: Normal range of motion.  Edema: None  Mental Status: Normal mood and affect. Normal behavior. Normal judgment and thought content.   Imaging: US MFM OB DETAIL +14 WK  Result Date: 09/23/2020 ----------------------------------------------------------------------  OBSTETRICS REPORT                    (Corrected Final 09/23/2020 10:14 am)  ---------------------------------------------------------------------- Patient Info  ID #:       [id number]                          D.O.B.:  [birth year] (34 yrs)  Name:       Kara Brady                      Visit Date: 09/23/2020 08:19 am ---------------------------------------------------------------------- Performed By  Attending:        Lin Landsmanorenthian Booker      Secondary Phy.:   Everardo [facility]                    MD  Performed By:     Emeline DarlingKasie E Kiser BS,      Address:          [phone]                    [place]                                                             [place], [place]  Referred By:      Gigi GinPEGGY                  Location:         [facility]                              [facility]  Ref. Address:     Faculty ---------------------------------------------------------------------- Orders  #  Description                           Code        Ordered By  1  [place] MFM OB DETAIL +14 WK               [id number]    PEGGY CONSTANT ----------------------------------------------------------------------  #  Order #                     Accession #                Episode #  1  [id number]                   [id number]                 [id number] ---------------------------------------------------------------------- Indications  Encounter for antenatal screening for          Z36.3  malformations  Hypertension - Chronic/Pre-                    O10.019  existing(Labetalol)  Poor obstetric history: Previous               O09.299  preeclampsia / eclampsia/gestational HTN  Poor obstetric history: Previous preterm       O09.219  delivery, antepartum (34 wks)  Negative Horizon (4 of 4)  Obesity complicating pregnancy, second         O99.212  trimester (Pregravid BMI 31)  [redacted] weeks gestation of pregnancy                Z3A.19  Low lying placenta,  antepartum                 O44.40 ---------------------------------------------------------------------- Vital Signs  Weight (lb): 171                               Height:        5'2"  BMI:         31.27 ---------------------------------------------------------------------- Fetal Evaluation  Num Of Fetuses:         1  Fetal Heart Rate(bpm):  150  Cardiac Activity:       Observed  Presentation:           Breech  Placenta:               Posterior, low-lying,  1.5 cm from int os  P. Cord Insertion:      Visualized  Amniotic Fluid  AFI FV:      Within normal limits                              Largest Pocket(cm)                              5.2 ---------------------------------------------------------------------- Biometry  BPD:      43.5  mm     G. Age:  19w 1d         58  %    CI:        77.69   %    70 - 86                                                          FL/HC:      17.5   %    16.1 - 18.3  HC:      156.2  mm     G. Age:  18w 4d         21  %    HC/AC:      1.17        1.09 - 1.39  AC:      133.9  mm     G. Age:  18w 6d         41  %    FL/BPD:     62.8   %  FL:       27.3  mm     G. Age:  18w 2d         21  %    FL/AC:      20.4   %    20 - 24  HUM:      26.6  mm     G. Age:  18w 3d         34  %  CER:      19.4  mm     G. Age:  18w 6d         30  %  NFT:       3.1  mm  LV:        6.9  mm  CM:        3.9  mm  Est. FW:     249  gm      0 lb 9 oz     25  % ---------------------------------------------------------------------- OB History  Gravidity:    4         Term:   1        Prem:   1        SAB:   1  TOP:          0       Ectopic:  0        Living: 1 ---------------------------------------------------------------------- Gestational Age  LMP:           19w 0d        Date:  05/13/20                 EDD:   02/17/21  U/S Today:     18w 5d                                        EDD:   02/19/21  Best:          19w 0d     Det. By:  LMP  (05/13/20)          EDD:   02/17/21  ---------------------------------------------------------------------- Anatomy  Cranium:               Appears normal         Aortic Arch:            Appears normal  Cavum:                 Appears normal         Ductal Arch:            Appears normal  Ventricles:            Appears normal         Diaphragm:              Appears normal  Choroid Plexus:        Appears normal         Stomach:                Appears normal, left                                                                        sided  Cerebellum:            Appears normal         Abdomen:                Appears normal  Posterior Fossa:       Appears normal         Abdominal Wall:         Appears nml (cord                                                                        insert, abd wall)  Nuchal Fold:           Appears normal         Cord Vessels:           Appears normal (3                                                                        vessel cord)  Face:  Appears normal         Kidneys:                Appear normal                         (orbits and profile)  Lips:                  Appears normal         Bladder:                Appears normal  Thoracic:              Appears normal         Spine:                  Appears normal  Heart:                 Appears normal         Upper Extremities:      Appears normal                         (4CH, axis, and                         situs)  RVOT:                  Previously seen        Lower Extremities:      Appears normal  LVOT:                  Previously seen  Other:  Heels visualized. Technically difficult due to maternal habitus and          fetal position. ---------------------------------------------------------------------- Cervix Uterus Adnexa  Cervix  Length:           3.65  cm.  Normal appearance by transabdominal scan.  Right Ovary  Within normal limits.  Left Ovary  Within normal limits.  Adnexa  No abnormality visualized.  ---------------------------------------------------------------------- Impression  Single intrauterine pregnancy here for a detailed anatomy  due to chronic hypertension (on labetalol)  and advanced  maternal age.  Normal anatomy with measurements consistent with dates  There is good fetal movement and amniotic fluid volume  Ms. An is a G4P2 with low risk NIPS and neg horizon.  I reviewed today's study and recommend serial growth, and  initiation of weekly testing at 32 weeks. We discussed the  increased risk for fetal growth restrcition, preeclampsia,  stillbirth and iatrogenic preterm birth.  We recommend blood pressure range <150/105 mmHg. If  her blood pressure increases beyond this we recommend  increasing blood antihypertensive therapy up to two agents.  Our preferred first line anti hypertensive is procardia XL.  If not previously performed consisder baseline labs of CBC,  CMP and protein creatinine ratio (Ms. Sterba has a normal  CMP and UPC).  In addition we reiterated daily low dose ASA for the  prevention of preeclampsia.  Lastly, Ms. Zirkelbach has a low lying placenta of 1.5 cm. She  denies bleeding I reassured her that this will not impact her  delivery and will likely be clear by [redacted] weeks gestation. ---------------------------------------------------------------------- Recommendations  Follow up growth in 4 weeks. ----------------------------------------------------------------------                    Lin Landsman, MD  Electronically Signed Corrected Final Report  09/23/2020 10:14 am ----------------------------------------------------------------------   Assessment and Plan:  Pregnancy: [id number] at [redacted]w[redacted]d 1. Chronic hypertension affecting pregnancy Stable BP on Labetalol. Continue for now, goal is <140/90. Continue ASA. Continue scans as per MFM.  2. [redacted] weeks gestation of pregnancy 3. Supervision of high risk pregnancy, antepartum Low lying placenta 1.5 cm seen on anatomy scan, no bleeding.  Will likely resolve with subsequent scans. Otherwise, no concerns.  Will check AFP today.  - Alpha fetoprotein, maternal No other complaints or concerns.  Routine obstetric precautions reviewed.  Please refer to After Visit Summary for other counseling recommendations.   Return in about 4 weeks (around 10/22/2020) for OFFICE OB VISIT (MD only).  Future Appointments  Date Time Provider Department Center  10/23/2020  8:30 AM [facility] NURSE [facility]  10/23/2020  8:45 AM [facility] US4 [facility]    Jaynie Collins, MD

## 2020-09-25 LAB — ALPHA FETOPROTEIN, MATERNAL
AFP MoM: 0.79
AFP, Serum: 33.4 ng/mL
Calc'd Gestational Age: 19.1 weeks
Maternal Wt: 195 [lb_av]
Risk for ONTD: <1
Twins-AFP: 1

## 2020-10-22 ENCOUNTER — Ambulatory Visit (INDEPENDENT_AMBULATORY_CARE_PROVIDER_SITE_OTHER): Payer: 59 | Admitting: Obstetrics & Gynecology

## 2020-10-22 ENCOUNTER — Other Ambulatory Visit: Payer: Self-pay

## 2020-10-22 VITALS — BP 111/72 | HR 87 | Wt 194.0 lb

## 2020-10-22 DIAGNOSIS — Z8742 Personal history of other diseases of the female genital tract: Secondary | ICD-10-CM

## 2020-10-22 DIAGNOSIS — O10919 Unspecified pre-existing hypertension complicating pregnancy, unspecified trimester: Secondary | ICD-10-CM

## 2020-10-22 DIAGNOSIS — Z3A23 23 weeks gestation of pregnancy: Secondary | ICD-10-CM

## 2020-10-22 NOTE — Progress Notes (Signed)
   PRENATAL VISIT NOTE  Subjective:  Kara Brady is a 34 y.o. [phone] at [redacted]w[redacted]d being seen today for ongoing prenatal care.  She is currently monitored for the following issues for this high-risk pregnancy and has Bloodgood disease; Clinical depression; Anxiety; Migraine without aura; Hair loss; Supervision of high risk pregnancy, antepartum; Chronic hypertension affecting pregnancy; Hx of preeclampsia, prior pregnancy, currently pregnant; History of preterm delivery; Rubella non-immune status, antepartum; and History of abnormal cervical Pap smear on their problem list.  Patient reports no complaints.  Contractions: Not present. Vag. Bleeding: None.  Movement: Present. Denies leaking of fluid.   The following portions of the patient's history were reviewed and updated as appropriate: allergies, current medications, past family history, past medical history, past social history, past surgical history and problem list.   Objective:   Vitals:   10/22/20 0852  BP: 111/72  Pulse: 87  Weight: 194 lb (88 kg)    Fetal Status: Fetal Heart Rate (bpm): 145   Movement: Present     General:  Alert, oriented and cooperative. Patient is in no acute distress.  Skin: Skin is warm and dry. No rash noted.   Cardiovascular: Normal heart rate noted  Respiratory: Normal respiratory effort, no problems with respiration noted  Abdomen: Soft, gravid, appropriate for gestational age.  Pain/Pressure: Present     Pelvic: Cervical exam deferred        Extremities: Normal range of motion.  Edema: None  Mental Status: Normal mood and affect. Normal behavior. Normal judgment and thought content.   Assessment and Plan:  Pregnancy: [id number] at [redacted]w[redacted]d 1. [redacted] weeks gestation of pregnancy - reviewed ultrasound.  Plan for growth scans discussed and monitoring towards end of pregnancy - recheck 4 weeks - needs 2hr GTT and tdap at follow up.  D/w pt.  She is ok with tdap. - q 4 week growth scans.  Next one scheduled  tomorrow.    2. History of abnormal cervical Pap smear - reviewed with pt this should be repeated during PP appointment  3. Chronic hypertension during pregnancy, antepartum - controlled BP with labetalol.  On baby ASA.   4. Rubella non-immune - discussed with pt doing this post partum  Preterm labor symptoms and general obstetric precautions including but not limited to vaginal bleeding, contractions, leaking of fluid and fetal movement were reviewed in detail with the patient. Please refer to After Visit Summary for other counseling recommendations.   Return for 2 hr GTT, Tdap, Office Ob visit (MD only).  Future Appointments  Date Time Provider Department Center  10/23/2020  8:30 AM [facility] NURSE [facility]  10/23/2020  8:45 AM [facility] US4 [facility]    Jerene Bears, MD

## 2020-10-23 ENCOUNTER — Ambulatory Visit: Payer: 59 | Attending: Maternal & Fetal Medicine

## 2020-10-23 ENCOUNTER — Encounter: Payer: Self-pay | Admitting: *Deleted

## 2020-10-23 ENCOUNTER — Ambulatory Visit: Payer: 59 | Admitting: *Deleted

## 2020-10-23 DIAGNOSIS — O0990 Supervision of high risk pregnancy, unspecified, unspecified trimester: Secondary | ICD-10-CM | POA: Diagnosis present

## 2020-10-23 DIAGNOSIS — O10919 Unspecified pre-existing hypertension complicating pregnancy, unspecified trimester: Secondary | ICD-10-CM | POA: Insufficient documentation

## 2020-10-23 DIAGNOSIS — O09299 Supervision of pregnancy with other poor reproductive or obstetric history, unspecified trimester: Secondary | ICD-10-CM

## 2020-10-23 DIAGNOSIS — O10012 Pre-existing essential hypertension complicating pregnancy, second trimester: Secondary | ICD-10-CM | POA: Diagnosis not present

## 2020-10-23 DIAGNOSIS — E669 Obesity, unspecified: Secondary | ICD-10-CM

## 2020-10-23 DIAGNOSIS — Z2839 Other underimmunization status: Secondary | ICD-10-CM | POA: Insufficient documentation

## 2020-10-23 DIAGNOSIS — Z8751 Personal history of pre-term labor: Secondary | ICD-10-CM | POA: Diagnosis present

## 2020-10-23 DIAGNOSIS — O10912 Unspecified pre-existing hypertension complicating pregnancy, second trimester: Secondary | ICD-10-CM | POA: Diagnosis not present

## 2020-10-23 DIAGNOSIS — O09899 Supervision of other high risk pregnancies, unspecified trimester: Secondary | ICD-10-CM

## 2020-10-23 DIAGNOSIS — O99212 Obesity complicating pregnancy, second trimester: Secondary | ICD-10-CM | POA: Diagnosis not present

## 2020-10-23 DIAGNOSIS — O4442 Low lying placenta NOS or without hemorrhage, second trimester: Secondary | ICD-10-CM

## 2020-10-23 DIAGNOSIS — Z362 Encounter for other antenatal screening follow-up: Secondary | ICD-10-CM

## 2020-10-23 DIAGNOSIS — O09212 Supervision of pregnancy with history of pre-term labor, second trimester: Secondary | ICD-10-CM | POA: Diagnosis not present

## 2020-10-23 DIAGNOSIS — Z3A23 23 weeks gestation of pregnancy: Secondary | ICD-10-CM

## 2020-10-23 DIAGNOSIS — Z8759 Personal history of other complications of pregnancy, childbirth and the puerperium: Secondary | ICD-10-CM

## 2020-10-23 DIAGNOSIS — Z3490 Encounter for supervision of normal pregnancy, unspecified, unspecified trimester: Secondary | ICD-10-CM

## 2020-10-26 ENCOUNTER — Other Ambulatory Visit: Payer: Self-pay | Admitting: *Deleted

## 2020-10-26 DIAGNOSIS — O10912 Unspecified pre-existing hypertension complicating pregnancy, second trimester: Secondary | ICD-10-CM

## 2020-11-12 ENCOUNTER — Encounter: Payer: 59 | Admitting: Obstetrics and Gynecology

## 2020-11-19 ENCOUNTER — Ambulatory Visit (INDEPENDENT_AMBULATORY_CARE_PROVIDER_SITE_OTHER): Payer: 59 | Admitting: Obstetrics & Gynecology

## 2020-11-19 ENCOUNTER — Other Ambulatory Visit: Payer: Self-pay

## 2020-11-19 VITALS — BP 123/81 | HR 73 | Wt 198.0 lb

## 2020-11-19 DIAGNOSIS — O0990 Supervision of high risk pregnancy, unspecified, unspecified trimester: Secondary | ICD-10-CM | POA: Diagnosis not present

## 2020-11-19 DIAGNOSIS — Z2839 Other underimmunization status: Secondary | ICD-10-CM

## 2020-11-19 DIAGNOSIS — O09899 Supervision of other high risk pregnancies, unspecified trimester: Secondary | ICD-10-CM

## 2020-11-19 DIAGNOSIS — Z23 Encounter for immunization: Secondary | ICD-10-CM | POA: Diagnosis not present

## 2020-11-19 DIAGNOSIS — Z3A27 27 weeks gestation of pregnancy: Secondary | ICD-10-CM

## 2020-11-19 DIAGNOSIS — O10919 Unspecified pre-existing hypertension complicating pregnancy, unspecified trimester: Secondary | ICD-10-CM

## 2020-11-19 NOTE — Progress Notes (Signed)
   PRENATAL VISIT NOTE  Subjective:  Kara Brady is a 35 y.o. [phone] at [redacted]w[redacted]d being seen today for ongoing prenatal care.  She is currently monitored for the following issues for this high-risk pregnancy and has Bloodgood disease; Clinical depression; Anxiety; Migraine without aura; Hair loss; Supervision of high risk pregnancy, antepartum; Chronic hypertension affecting pregnancy; Hx of preeclampsia, prior pregnancy, currently pregnant; History of preterm delivery; Rubella non-immune status, antepartum; and History of abnormal cervical Pap smear on their problem list.  Patient reports no complaints.  Contractions: Not present. Vag. Bleeding: None.  Movement: Present. Denies leaking of fluid.   The following portions of the patient's history were reviewed and updated as appropriate: allergies, current medications, past family history, past medical history, past social history, past surgical history and problem list.   Objective:   Vitals:   11/19/20 0812  BP: 123/81  Pulse: 73  Weight: 198 lb (89.8 kg)    Fetal Status: Fetal Heart Rate (bpm): 131 Fundal Height: 27 cm Movement: Present     General:  Alert, oriented and cooperative. Patient is in no acute distress.  Skin: Skin is warm and dry. No rash noted.   Cardiovascular: Normal heart rate noted  Respiratory: Normal respiratory effort, no problems with respiration noted  Abdomen: Soft, gravid, appropriate for gestational age.  Pain/Pressure: Present     Pelvic: Cervical exam deferred        Extremities: Normal range of motion.  Edema: None  Mental Status: Normal mood and affect. Normal behavior. Normal judgment and thought content.   Assessment and Plan:  Pregnancy: [id number] at [redacted]w[redacted]d 1. [redacted] weeks gestation of pregnancy  2. Supervision of high risk pregnancy, antepartum - 2Hr GTT w/ 1 Hr Carpenter 75 g - CBC - HIV antibody (with reflex) - RPR - Tdap vaccine greater than or equal to 7yo IM  3. Chronic hypertension during  pregnancy, antepartum -on labetalol 200mg  bid  4. Rubella non-immune status, antepartum - aware needs pp immunization  Preterm labor symptoms and general obstetric precautions including but not limited to vaginal bleeding, contractions, leaking of fluid and fetal movement were reviewed in detail with the patient. Please refer to After Visit Summary for other counseling recommendations.   Return for recheck 3-4 weeks, Office ob visit (MD or APP).  Future Appointments  Date Time Provider Department Center  11/20/2020  8:45 AM [facility] NURSE [facility]  11/20/2020  9:00 AM [facility] US1 [facility]    [facility] MD

## 2020-11-20 ENCOUNTER — Ambulatory Visit: Payer: 59 | Admitting: *Deleted

## 2020-11-20 ENCOUNTER — Ambulatory Visit: Payer: 59 | Attending: Obstetrics

## 2020-11-20 ENCOUNTER — Other Ambulatory Visit: Payer: Self-pay | Admitting: *Deleted

## 2020-11-20 DIAGNOSIS — O09299 Supervision of pregnancy with other poor reproductive or obstetric history, unspecified trimester: Secondary | ICD-10-CM

## 2020-11-20 DIAGNOSIS — Z8751 Personal history of pre-term labor: Secondary | ICD-10-CM | POA: Insufficient documentation

## 2020-11-20 DIAGNOSIS — Z3A27 27 weeks gestation of pregnancy: Secondary | ICD-10-CM

## 2020-11-20 DIAGNOSIS — O09292 Supervision of pregnancy with other poor reproductive or obstetric history, second trimester: Secondary | ICD-10-CM | POA: Diagnosis not present

## 2020-11-20 DIAGNOSIS — O10912 Unspecified pre-existing hypertension complicating pregnancy, second trimester: Secondary | ICD-10-CM | POA: Insufficient documentation

## 2020-11-20 DIAGNOSIS — O0990 Supervision of high risk pregnancy, unspecified, unspecified trimester: Secondary | ICD-10-CM | POA: Insufficient documentation

## 2020-11-20 DIAGNOSIS — O10919 Unspecified pre-existing hypertension complicating pregnancy, unspecified trimester: Secondary | ICD-10-CM

## 2020-11-20 DIAGNOSIS — O99212 Obesity complicating pregnancy, second trimester: Secondary | ICD-10-CM

## 2020-11-20 DIAGNOSIS — Z2839 Other underimmunization status: Secondary | ICD-10-CM

## 2020-11-20 DIAGNOSIS — E669 Obesity, unspecified: Secondary | ICD-10-CM | POA: Diagnosis not present

## 2020-11-20 DIAGNOSIS — O09212 Supervision of pregnancy with history of pre-term labor, second trimester: Secondary | ICD-10-CM

## 2020-11-20 LAB — CBC
HCT: 38.1 % (ref 35.0–45.0)
Hemoglobin: 12.3 g/dL (ref 11.7–15.5)
MCH: 29.2 pg (ref 27.0–33.0)
MCHC: 32.3 g/dL (ref 32.0–36.0)
MCV: 90.5 fL (ref 80.0–100.0)
MPV: 10.5 fL (ref 7.5–12.5)
Platelets: 278 10*3/uL (ref 140–400)
RBC: 4.21 10*6/uL (ref 3.80–5.10)
RDW: 12.8 % (ref 11.0–15.0)
WBC: 10.2 10*3/uL (ref 3.8–10.8)

## 2020-11-20 LAB — 2HR GTT W 1 HR, CARPENTER, 75 G
Glucose, 1 Hr, Gest: 126 mg/dL (ref 65–179)
Glucose, 2 Hr, Gest: 122 mg/dL (ref 65–152)
Glucose, Fasting, Gest: 77 mg/dL (ref 65–91)

## 2020-11-20 LAB — RPR: RPR Ser Ql: NONREACTIVE

## 2020-11-20 LAB — HIV ANTIBODY (ROUTINE TESTING W REFLEX): HIV 1&2 Ab, 4th Generation: NONREACTIVE

## 2020-12-11 ENCOUNTER — Other Ambulatory Visit: Payer: Self-pay

## 2020-12-11 ENCOUNTER — Ambulatory Visit (INDEPENDENT_AMBULATORY_CARE_PROVIDER_SITE_OTHER): Payer: 59 | Admitting: Certified Nurse Midwife

## 2020-12-11 VITALS — BP 113/78 | HR 83 | Wt 202.0 lb

## 2020-12-11 DIAGNOSIS — O2600 Excessive weight gain in pregnancy, unspecified trimester: Secondary | ICD-10-CM | POA: Insufficient documentation

## 2020-12-11 DIAGNOSIS — O10919 Unspecified pre-existing hypertension complicating pregnancy, unspecified trimester: Secondary | ICD-10-CM

## 2020-12-11 DIAGNOSIS — O0990 Supervision of high risk pregnancy, unspecified, unspecified trimester: Secondary | ICD-10-CM

## 2020-12-11 DIAGNOSIS — O2603 Excessive weight gain in pregnancy, third trimester: Secondary | ICD-10-CM

## 2020-12-11 DIAGNOSIS — O4440 Low lying placenta NOS or without hemorrhage, unspecified trimester: Secondary | ICD-10-CM | POA: Insufficient documentation

## 2020-12-11 DIAGNOSIS — Z3A30 30 weeks gestation of pregnancy: Secondary | ICD-10-CM

## 2020-12-11 DIAGNOSIS — Z8742 Personal history of other diseases of the female genital tract: Secondary | ICD-10-CM

## 2020-12-11 NOTE — Progress Notes (Addendum)
Subjective:  Kara Brady is a 35 y.o. [phone] at [redacted]w[redacted]d being seen today for ongoing prenatal care.  She is currently monitored for the following issues for this high-risk pregnancy and has Bloodgood disease; Clinical depression; Anxiety; Migraine without aura; Hair loss; Supervision of high risk pregnancy, antepartum; Chronic hypertension affecting pregnancy; Hx of preeclampsia, prior pregnancy, currently pregnant; History of preterm delivery; Rubella non-immune status, antepartum; History of abnormal cervical Pap smear; Low-lying placenta; and Excess weight gain in pregnancy on their problem list.  Patient reports no complaints.  Contractions: Not present. Vag. Bleeding: None.  Movement: Present. Denies leaking of fluid.   The following portions of the patient's history were reviewed and updated as appropriate: allergies, current medications, past family history, past medical history, past social history, past surgical history and problem list. Problem list updated.  Objective:   Vitals:   12/11/20 0812  BP: 113/78  Pulse: 83  Weight: 202 lb (91.6 kg)    Fetal Status: Fetal Heart Rate (bpm): 148   Movement: Present     General:  Alert, oriented and cooperative. Patient is in no acute distress.  Skin: Skin is warm and dry. No rash noted.   Cardiovascular: Normal heart rate noted  Respiratory: Normal respiratory effort, no problems with respiration noted  Abdomen: Soft, gravid, appropriate for gestational age. Pain/Pressure: Absent     Pelvic: Vag. Bleeding: None Vag D/C Character: Thin   Cervical exam deferred        Extremities: Normal range of motion.  Edema: None  Mental Status: Normal mood and affect. Normal behavior. Normal judgment and thought content.   Urinalysis:      Assessment and Plan:  Pregnancy: [id number] at [redacted]w[redacted]d  1. Supervision of high risk pregnancy, antepartum   2. Chronic hypertension affecting pregnancy - stable on Labetalol - normal AFV and growth - f/u at MFM  for AT - delivery 37-39 wks - PEC precautions  3. Low-lying placenta -resolved  4. Excessive weight gain during pregnancy in third trimester   5. History of abnormal cervical Pap smear - last pap 2019 normal - declined today, will do at next visit  6. [redacted] weeks gestation   Preterm labor symptoms and general obstetric precautions including but not limited to vaginal bleeding, contractions, leaking of fluid and fetal movement were reviewed in detail with the patient. Please refer to After Visit Summary for other counseling recommendations.  Return in about 2 weeks (around 12/25/2020).   Donette Larry, CNM

## 2020-12-18 ENCOUNTER — Ambulatory Visit: Payer: 59 | Attending: Obstetrics

## 2020-12-18 ENCOUNTER — Other Ambulatory Visit: Payer: Self-pay | Admitting: *Deleted

## 2020-12-18 ENCOUNTER — Other Ambulatory Visit: Payer: Self-pay

## 2020-12-18 ENCOUNTER — Ambulatory Visit: Payer: 59 | Admitting: *Deleted

## 2020-12-18 VITALS — BP 119/74 | HR 84

## 2020-12-18 DIAGNOSIS — Z8751 Personal history of pre-term labor: Secondary | ICD-10-CM | POA: Insufficient documentation

## 2020-12-18 DIAGNOSIS — Z2839 Other underimmunization status: Secondary | ICD-10-CM | POA: Insufficient documentation

## 2020-12-18 DIAGNOSIS — O0990 Supervision of high risk pregnancy, unspecified, unspecified trimester: Secondary | ICD-10-CM | POA: Insufficient documentation

## 2020-12-18 DIAGNOSIS — O09213 Supervision of pregnancy with history of pre-term labor, third trimester: Secondary | ICD-10-CM

## 2020-12-18 DIAGNOSIS — O09299 Supervision of pregnancy with other poor reproductive or obstetric history, unspecified trimester: Secondary | ICD-10-CM | POA: Insufficient documentation

## 2020-12-18 DIAGNOSIS — Z362 Encounter for other antenatal screening follow-up: Secondary | ICD-10-CM | POA: Diagnosis not present

## 2020-12-18 DIAGNOSIS — O10919 Unspecified pre-existing hypertension complicating pregnancy, unspecified trimester: Secondary | ICD-10-CM

## 2020-12-18 DIAGNOSIS — O99213 Obesity complicating pregnancy, third trimester: Secondary | ICD-10-CM

## 2020-12-18 DIAGNOSIS — O4440 Low lying placenta NOS or without hemorrhage, unspecified trimester: Secondary | ICD-10-CM

## 2020-12-18 DIAGNOSIS — E669 Obesity, unspecified: Secondary | ICD-10-CM

## 2020-12-18 DIAGNOSIS — O10912 Unspecified pre-existing hypertension complicating pregnancy, second trimester: Secondary | ICD-10-CM | POA: Diagnosis not present

## 2020-12-18 DIAGNOSIS — O10913 Unspecified pre-existing hypertension complicating pregnancy, third trimester: Secondary | ICD-10-CM

## 2020-12-18 DIAGNOSIS — O10013 Pre-existing essential hypertension complicating pregnancy, third trimester: Secondary | ICD-10-CM | POA: Diagnosis not present

## 2020-12-18 DIAGNOSIS — Z3A31 31 weeks gestation of pregnancy: Secondary | ICD-10-CM

## 2020-12-22 ENCOUNTER — Other Ambulatory Visit: Payer: Self-pay

## 2020-12-22 ENCOUNTER — Telehealth: Payer: Self-pay

## 2020-12-22 ENCOUNTER — Encounter (HOSPITAL_COMMUNITY): Payer: Self-pay | Admitting: Obstetrics and Gynecology

## 2020-12-22 ENCOUNTER — Inpatient Hospital Stay (HOSPITAL_COMMUNITY)
Admission: AD | Admit: 2020-12-22 | Discharge: 2020-12-22 | Disposition: A | Discharge status: Home / Self Care | Payer: 59 | Attending: Obstetrics and Gynecology | Admitting: Obstetrics and Gynecology

## 2020-12-22 DIAGNOSIS — O26893 Other specified pregnancy related conditions, third trimester: Secondary | ICD-10-CM | POA: Diagnosis present

## 2020-12-22 DIAGNOSIS — Z7982 Long term (current) use of aspirin: Secondary | ICD-10-CM | POA: Insufficient documentation

## 2020-12-22 DIAGNOSIS — O10913 Unspecified pre-existing hypertension complicating pregnancy, third trimester: Secondary | ICD-10-CM | POA: Diagnosis not present

## 2020-12-22 DIAGNOSIS — Z3A31 31 weeks gestation of pregnancy: Secondary | ICD-10-CM | POA: Diagnosis not present

## 2020-12-22 DIAGNOSIS — Z8249 Family history of ischemic heart disease and other diseases of the circulatory system: Secondary | ICD-10-CM | POA: Diagnosis not present

## 2020-12-22 DIAGNOSIS — R519 Headache, unspecified: Secondary | ICD-10-CM | POA: Diagnosis not present

## 2020-12-22 DIAGNOSIS — O09523 Supervision of elderly multigravida, third trimester: Secondary | ICD-10-CM | POA: Diagnosis not present

## 2020-12-22 DIAGNOSIS — R1010 Upper abdominal pain, unspecified: Secondary | ICD-10-CM | POA: Diagnosis not present

## 2020-12-22 DIAGNOSIS — Z79899 Other long term (current) drug therapy: Secondary | ICD-10-CM | POA: Diagnosis not present

## 2020-12-22 DIAGNOSIS — O10919 Unspecified pre-existing hypertension complicating pregnancy, unspecified trimester: Secondary | ICD-10-CM

## 2020-12-22 LAB — URINALYSIS, ROUTINE W REFLEX MICROSCOPIC
Bilirubin Urine: NEGATIVE
Glucose, UA: NEGATIVE mg/dL
Ketones, ur: NEGATIVE mg/dL
Leukocytes,Ua: NEGATIVE
Nitrite: NEGATIVE
Protein, ur: NEGATIVE mg/dL
Specific Gravity, Urine: 1.006 (ref 1.005–1.030)
pH: 6 (ref 5.0–8.0)

## 2020-12-22 LAB — PROTEIN / CREATININE RATIO, URINE
Creatinine, Urine: 31.86 mg/dL
Total Protein, Urine: <6 mg/dL

## 2020-12-22 LAB — CBC
HCT: 39.9 % (ref 36.0–46.0)
Hemoglobin: 13.2 g/dL (ref 12.0–15.0)
MCH: 30 pg (ref 26.0–34.0)
MCHC: 33.1 g/dL (ref 30.0–36.0)
MCV: 90.7 fL (ref 80.0–100.0)
Platelets: 288 10*3/uL (ref 150–400)
RBC: 4.4 MIL/uL (ref 3.87–5.11)
RDW: 14.2 % (ref 11.5–15.5)
WBC: 9.1 10*3/uL (ref 4.0–10.5)
nRBC: 0 % (ref 0.0–0.2)

## 2020-12-22 LAB — COMPREHENSIVE METABOLIC PANEL
ALT: 15 U/L (ref 0–44)
AST: 21 U/L (ref 15–41)
Albumin: 2.7 g/dL — ABNORMAL LOW (ref 3.5–5.0)
Alkaline Phosphatase: 61 U/L (ref 38–126)
Anion gap: 7 (ref 5–15)
BUN: 6 mg/dL (ref 6–20)
CO2: 22 mmol/L (ref 22–32)
Calcium: 9.4 mg/dL (ref 8.9–10.3)
Chloride: 107 mmol/L (ref 98–111)
Creatinine, Ser: 0.6 mg/dL (ref 0.44–1.00)
GFR, Estimated: >60 mL/min (ref >60)
Glucose, Bld: 91 mg/dL (ref 70–99)
Potassium: 4.2 mmol/L (ref 3.5–5.1)
Sodium: 136 mmol/L (ref 135–145)
Total Bilirubin: 0.4 mg/dL (ref 0.3–1.2)
Total Protein: 6.4 g/dL — ABNORMAL LOW (ref 6.5–8.1)

## 2020-12-22 MED ORDER — DEXAMETHASONE SODIUM PHOSPHATE 10 MG/ML IJ SOLN
10.0000 mg | Freq: Once | INTRAMUSCULAR | Status: AC
  Administered 2020-12-22: 10 mg via INTRAVENOUS
  Filled 2020-12-22: qty 1

## 2020-12-22 MED ORDER — DIPHENHYDRAMINE HCL 50 MG/ML IJ SOLN
25.0000 mg | Freq: Once | INTRAMUSCULAR | Status: AC
  Administered 2020-12-22: 25 mg via INTRAVENOUS
  Filled 2020-12-22: qty 1

## 2020-12-22 MED ORDER — METOCLOPRAMIDE HCL 5 MG/ML IJ SOLN
10.0000 mg | Freq: Once | INTRAMUSCULAR | Status: AC
  Administered 2020-12-22: 10 mg via INTRAVENOUS
  Filled 2020-12-22: qty 2

## 2020-12-22 MED ORDER — NIFEDIPINE 10 MG PO CAPS
10.0000 mg | ORAL_CAPSULE | ORAL | Status: AC | PRN
  Administered 2020-12-22 (×3): 10 mg via ORAL
  Filled 2020-12-22 (×3): qty 1

## 2020-12-22 MED ORDER — LACTATED RINGERS IV SOLN: Freq: Once | INTRAVENOUS | Status: AC

## 2020-12-22 NOTE — MAU Provider Note (Signed)
History    CSN: [id number]  Arrival date and time: 12/22/20 7672  Event Date/Time  First Provider Initiated Contact with Patient 12/22/20 1212     Chief Complaint  Patient presents with   Hypertension   Headache   HPI Kara Brady is a 35 y.o. [id number] at [redacted]w[redacted]d who presents to MAU with chief complaint of headache and blood pressure elevation. Her pregnancy is c/b Chronic Hypertension managed with Labetalol 200 mg BID. She endorses elevated blood pressure of 148/83 on her home cuff.  Headache Patient states she woke up with a severe anterior headache this morning in the setting of history of headaches. She was not aware that migraines were on her Problem List and states she has never been told she had migraines. Pain score is 8-9/10. She prefers to limit medicine and so has not attempted management. She denies aggravating or alleviating factors. She endorses brief period of visual "floaters" when her headache was at its worst. The resolved without intervention prior to her arrival to MAU.   On arrival to MAU patient denies contractions. About one hour s/p initial assessment she endorsed new onset upper abdominal pain. Pain score 4/10.   She denies RUQ/epigastric pain, new onset swelling or weight gain. She denies vaginal bleeding, leaking of fluid, decreased fetal movement, fever, falls, or recent illness.   OB History     Gravida  4   Para  2   Term  1   Preterm  1   AB  1   Living  2      SAB  1   IAB      Ectopic      Multiple  0   Live Births  2           Past Medical History:  Diagnosis Date   Headache    History of migraine headaches    Hypertension     Past Surgical History:  Procedure Laterality Date   BREAST BIOPSY      Family History  Problem Relation Age of Onset   Hypertension Mother    Diabetes Paternal Grandfather     Social History   Tobacco Use   Smoking status: Never   Smokeless tobacco: Never  Vaping Use   Vaping Use: Never  used  Substance Use Topics   Alcohol use: No   Drug use: No    Allergies: No Known Allergies  Medications Prior to Admission  Medication Sig Dispense Refill Last Dose   aspirin EC 81 MG tablet Take 1 tablet (81 mg total) by mouth daily. Take after 12 weeks for prevention of preeclampsia later in pregnancy 300 tablet 0    labetalol (NORMODYNE) 200 MG tablet Take 200 mg by mouth 2 (two) times daily.      Prenatal Vit-Fe Fumarate-FA (PRENATAL MULTIVITAMIN) TABS tablet Take 1 tablet by mouth daily at 12 noon.       Review of Systems  Eyes:  Negative for photophobia and visual disturbance.  Gastrointestinal:  Negative for abdominal pain.  Musculoskeletal:  Negative for back pain.  Neurological:  Positive for headaches.  All other systems reviewed and are negative. Physical Exam   Blood pressure 121/78, pulse 77, temperature 98.4 F (36.9 C), temperature source Oral, resp. rate 16, last menstrual period 05/13/2020, SpO2 95 %.  Physical Exam Vitals and nursing note reviewed.  Constitutional:      Appearance: She is well-developed. She is obese.  Cardiovascular:     Rate and Rhythm: Normal  rate.     Heart sounds: Normal heart sounds.  Pulmonary:     Effort: Pulmonary effort is normal.     Breath sounds: Normal breath sounds.  Abdominal:     Palpations: Abdomen is soft.     Comments: Gravid  Skin:    General: Skin is warm and dry.     Capillary Refill: Capillary refill takes less than 2 seconds.  Neurological:     Mental Status: She is alert and oriented to person, place, and time.  Psychiatric:        Mood and Affect: Mood normal.        Speech: Speech normal.        Behavior: Behavior normal.    MAU Course  Procedures  --Reactive tracing: baseline 140, mod var, + accels, no decels --Toco: irregular contractions q 2-10 min, resolving with Procardia --Pt took Labetalol this morning. Blood pressure labile. No severe range --Discussed with patient that Procardia given for  contractions can lower BP but first normal BPs at 1116 and 1130. First Procardia given at 1122. Reassured this means BP is well controlled, not seeing false reassuring BPs due to Procardia --No visual disturbances during evaluation in MAU. Headache resolved with IV cockail. PEC labs WNL. Discussed with Dr. Vergie Living. No alteration in regimen indicated at this time.   Patient Vitals for the past 24 hrs:  BP Temp Temp src Pulse Resp SpO2  12/22/20 1205 -- -- -- -- -- 97 %  12/22/20 1201 121/78 -- -- 77 16 --  12/22/20 1143 115/71 -- -- -- -- --  12/22/20 1130 125/84 -- -- 71 -- 95 %  12/22/20 1116 116/86 -- -- 74 -- --  12/22/20 1101 (!) 142/88 -- -- 80 -- --  12/22/20 1046 140/90 -- -- 73 -- --  12/22/20 1031 (!) 142/94 -- -- 76 -- --  12/22/20 1011 (!) 135/91 98.4 F (36.9 C) Oral 75 16 96 %   Results for orders placed or performed during the hospital encounter of 12/22/20 (from the past 24 hour(s))  Urinalysis, Routine w reflex microscopic Urine, Clean Catch     Status: Abnormal   Collection Time: 12/22/20 10:18 AM  Result Value Ref Range   Color, Urine YELLOW YELLOW   APPearance HAZY (A) CLEAR   Specific Gravity, Urine 1.006 1.005 - 1.030   pH 6.0 5.0 - 8.0   Glucose, UA NEGATIVE NEGATIVE mg/dL   Hgb urine dipstick MODERATE (A) NEGATIVE   Bilirubin Urine NEGATIVE NEGATIVE   Ketones, ur NEGATIVE NEGATIVE mg/dL   Protein, ur NEGATIVE NEGATIVE mg/dL   Nitrite NEGATIVE NEGATIVE   Leukocytes,Ua NEGATIVE NEGATIVE   RBC / HPF 0-5 0 - 5 RBC/hpf   WBC, UA 0-5 0 - 5 WBC/hpf   Bacteria, UA RARE (A) NONE SEEN   Squamous Epithelial / LPF 6-10 0 - 5  Protein / creatinine ratio, urine     Status: None   Collection Time: 12/22/20 10:18 AM  Result Value Ref Range   Creatinine, Urine 31.86 mg/dL   Total Protein, Urine <6 mg/dL   Protein Creatinine Ratio        0.00 - 0.15 mg/mg[Cre]  CBC     Status: None   Collection Time: 12/22/20 10:30 AM  Result Value Ref Range   WBC 9.1 4.0 - 10.5 K/uL    RBC 4.40 3.87 - 5.11 MIL/uL   Hemoglobin 13.2 12.0 - 15.0 g/dL   HCT 45.8 09.9 - 83.3 %   MCV 90.7  80.0 - 100.0 fL   MCH 30.0 26.0 - 34.0 pg   MCHC 33.1 30.0 - 36.0 g/dL   RDW 04.5 40.9 - 81.1 %   Platelets 288 150 - 400 K/uL   nRBC 0.0 0.0 - 0.2 %  Comprehensive metabolic panel     Status: Abnormal   Collection Time: 12/22/20 10:30 AM  Result Value Ref Range   Sodium 136 135 - 145 mmol/L   Potassium 4.2 3.5 - 5.1 mmol/L   Chloride 107 98 - 111 mmol/L   CO2 22 22 - 32 mmol/L   Glucose, Bld 91 70 - 99 mg/dL   BUN 6 6 - 20 mg/dL   Creatinine, Ser 9.14 0.44 - 1.00 mg/dL   Calcium 9.4 8.9 - 78.2 mg/dL   Total Protein 6.4 (L) 6.5 - 8.1 g/dL   Albumin 2.7 (L) 3.5 - 5.0 g/dL   AST 21 15 - 41 U/L   ALT 15 0 - 44 U/L   Alkaline Phosphatase 61 38 - 126 U/L   Total Bilirubin 0.4 0.3 - 1.2 mg/dL   GFR, Estimated >95 >62 mL/min   Anion gap 7 5 - 15   Meds ordered this encounter  Medications   lactated ringers infusion   metoCLOPramide (REGLAN) injection 10 mg   diphenhydrAMINE (BENADRYL) injection 25 mg   dexamethasone (DECADRON) injection 10 mg   NIFEdipine (PROCARDIA) capsule 10 mg   Assessment and Plan  --35 y.o. [id number] at [redacted]w[redacted]d  --Reactive tracing --Closed cervix --CHTN on Labetalol 200 mg BID --PEC labs WNL --Pain score 0/10 prior to discharge --Discharge home in stable condition with PEC precautions  F/U: --Next appointment at [facility] is 12/24/2020  Calvert Cantor, CNM 12/22/2020, 8:00 PM

## 2020-12-22 NOTE — Telephone Encounter (Signed)
Pt called stating her BP is 148/83. Pt states she has had a headache since earlier this morning. She states she has "fullness" in her chest and it feels like she is unable to take a deep breath. Nolene Bernheim, NP stated that pt needs to go to MAU. Pt was told to go to MAU and pt expressed understanding.

## 2020-12-22 NOTE — MAU Note (Signed)
Kara Brady is a 35 y.o. at [redacted]w[redacted]d here in MAU reporting: woke up with a headache this AM so she took her BP and it was elevated. Had some floaters while she was in the lobby. Denies contractions, LOF, and bleeding. +FM. Is on labetalol 200mg  BID and took her dose this AM, did not take anything for the headache.  Onset of complaint: today  Pain score: 8/10  Vitals:   12/22/20 1011  BP: (!) 135/91  Pulse: 75  Resp: 16  Temp: 98.4 F (36.9 C)  SpO2: 96%     FHT: EFM applied in room  Lab orders placed from triage: UA

## 2020-12-24 ENCOUNTER — Other Ambulatory Visit: Payer: Self-pay

## 2020-12-24 ENCOUNTER — Ambulatory Visit (INDEPENDENT_AMBULATORY_CARE_PROVIDER_SITE_OTHER): Payer: 59 | Admitting: Obstetrics and Gynecology

## 2020-12-24 ENCOUNTER — Encounter: Payer: Self-pay | Admitting: Obstetrics and Gynecology

## 2020-12-24 VITALS — BP 133/83 | HR 71 | Wt 205.0 lb

## 2020-12-24 DIAGNOSIS — O09299 Supervision of pregnancy with other poor reproductive or obstetric history, unspecified trimester: Secondary | ICD-10-CM

## 2020-12-24 DIAGNOSIS — O0990 Supervision of high risk pregnancy, unspecified, unspecified trimester: Secondary | ICD-10-CM

## 2020-12-24 DIAGNOSIS — O2603 Excessive weight gain in pregnancy, third trimester: Secondary | ICD-10-CM

## 2020-12-24 DIAGNOSIS — O10919 Unspecified pre-existing hypertension complicating pregnancy, unspecified trimester: Secondary | ICD-10-CM

## 2020-12-24 NOTE — Progress Notes (Signed)
   PRENATAL VISIT NOTE  Subjective:  Kara Brady is a 35 y.o. [phone] at [redacted]w[redacted]d being seen today for ongoing prenatal care.  She is currently monitored for the following issues for this high-risk pregnancy and has Bloodgood disease; Clinical depression; Anxiety; Migraine without aura; Hair loss; Supervision of high risk pregnancy, antepartum; Chronic hypertension affecting pregnancy; Hx of preeclampsia, prior pregnancy, currently pregnant; History of preterm delivery; Rubella non-immune status, antepartum; History of abnormal cervical Pap smear; Low-lying placenta; and Excess weight gain in pregnancy on their problem list.  Patient reports no complaints.  Contractions: Irritability. Vag. Bleeding: None.  Movement: Present. Denies leaking of fluid.   The following portions of the patient's history were reviewed and updated as appropriate: allergies, current medications, past family history, past medical history, past social history, past surgical history and problem list.   Objective:   Vitals:   12/24/20 1107  BP: 133/83  Pulse: 71  Weight: 205 lb (93 kg)    Fetal Status: Fetal Heart Rate (bpm): 142   Movement: Present     General:  Alert, oriented and cooperative. Patient is in no acute distress.  Skin: Skin is warm and dry. No rash noted.   Cardiovascular: Normal heart rate noted  Respiratory: Normal respiratory effort, no problems with respiration noted  Abdomen: Soft, gravid, appropriate for gestational age.  Pain/Pressure: Absent     Pelvic: Cervical exam deferred        Extremities: Normal range of motion.  Edema: None  Mental Status: Normal mood and affect. Normal behavior. Normal judgment and thought content.   Assessment and Plan:  Pregnancy: [id number] at [redacted]w[redacted]d 1. Supervision of high risk pregnancy, antepartum Patient is doing well without complaints Plans pills for contraception  2. Chronic hypertension affecting pregnancy Stable on labetalol Continue ASA Follow up  ultrasound tomorrow  3. Hx of preeclampsia, prior pregnancy, currently pregnant Seen in MAU on 7/5  4. Excessive weight gain during pregnancy in third trimester   Preterm labor symptoms and general obstetric precautions including but not limited to vaginal bleeding, contractions, leaking of fluid and fetal movement were reviewed in detail with the patient. Please refer to After Visit Summary for other counseling recommendations.   No follow-ups on file.  Future Appointments  Date Time Provider Department Center  12/25/2020  8:30 AM [facility] NURSE [facility]  12/25/2020  8:45 AM [facility] US4 [facility]  01/01/2021  7:45 AM [facility] NURSE [facility]  01/01/2021  8:00 AM [facility] US1 [facility]  01/14/2021 10:15 AM [facility] NST [facility]  01/15/2021  8:45 AM [facility] NURSE [facility]  01/15/2021  9:00 AM [facility] US1 [facility]  01/28/2021 11:15 AM [facility] NST [facility]    Kiosha Buchan, MD

## 2020-12-25 ENCOUNTER — Ambulatory Visit: Payer: 59 | Admitting: *Deleted

## 2020-12-25 ENCOUNTER — Ambulatory Visit: Payer: 59 | Attending: Obstetrics

## 2020-12-25 ENCOUNTER — Encounter: Payer: Self-pay | Admitting: *Deleted

## 2020-12-25 ENCOUNTER — Other Ambulatory Visit: Payer: Self-pay | Admitting: *Deleted

## 2020-12-25 VITALS — BP 124/78 | HR 78

## 2020-12-25 DIAGNOSIS — O0990 Supervision of high risk pregnancy, unspecified, unspecified trimester: Secondary | ICD-10-CM | POA: Insufficient documentation

## 2020-12-25 DIAGNOSIS — Z8751 Personal history of pre-term labor: Secondary | ICD-10-CM | POA: Insufficient documentation

## 2020-12-25 DIAGNOSIS — E669 Obesity, unspecified: Secondary | ICD-10-CM

## 2020-12-25 DIAGNOSIS — O10919 Unspecified pre-existing hypertension complicating pregnancy, unspecified trimester: Secondary | ICD-10-CM | POA: Insufficient documentation

## 2020-12-25 DIAGNOSIS — O10013 Pre-existing essential hypertension complicating pregnancy, third trimester: Secondary | ICD-10-CM

## 2020-12-25 DIAGNOSIS — O09299 Supervision of pregnancy with other poor reproductive or obstetric history, unspecified trimester: Secondary | ICD-10-CM | POA: Diagnosis present

## 2020-12-25 DIAGNOSIS — O09213 Supervision of pregnancy with history of pre-term labor, third trimester: Secondary | ICD-10-CM

## 2020-12-25 DIAGNOSIS — O10912 Unspecified pre-existing hypertension complicating pregnancy, second trimester: Secondary | ICD-10-CM | POA: Insufficient documentation

## 2020-12-25 DIAGNOSIS — O99213 Obesity complicating pregnancy, third trimester: Secondary | ICD-10-CM | POA: Diagnosis not present

## 2020-12-25 DIAGNOSIS — Z2839 Other underimmunization status: Secondary | ICD-10-CM | POA: Diagnosis present

## 2020-12-25 DIAGNOSIS — Z362 Encounter for other antenatal screening follow-up: Secondary | ICD-10-CM

## 2020-12-25 DIAGNOSIS — O4440 Low lying placenta NOS or without hemorrhage, unspecified trimester: Secondary | ICD-10-CM | POA: Diagnosis present

## 2020-12-25 DIAGNOSIS — O09899 Supervision of other high risk pregnancies, unspecified trimester: Secondary | ICD-10-CM

## 2020-12-25 DIAGNOSIS — Z3A32 32 weeks gestation of pregnancy: Secondary | ICD-10-CM

## 2021-01-01 ENCOUNTER — Other Ambulatory Visit: Payer: Self-pay

## 2021-01-01 ENCOUNTER — Encounter: Payer: Self-pay | Admitting: *Deleted

## 2021-01-01 ENCOUNTER — Ambulatory Visit: Payer: 59 | Admitting: *Deleted

## 2021-01-01 ENCOUNTER — Ambulatory Visit: Payer: 59 | Attending: Obstetrics

## 2021-01-01 VITALS — BP 116/75 | HR 73

## 2021-01-01 DIAGNOSIS — O10919 Unspecified pre-existing hypertension complicating pregnancy, unspecified trimester: Secondary | ICD-10-CM

## 2021-01-01 DIAGNOSIS — O0990 Supervision of high risk pregnancy, unspecified, unspecified trimester: Secondary | ICD-10-CM

## 2021-01-01 DIAGNOSIS — Z8751 Personal history of pre-term labor: Secondary | ICD-10-CM

## 2021-01-01 DIAGNOSIS — O403XX0 Polyhydramnios, third trimester, not applicable or unspecified: Secondary | ICD-10-CM

## 2021-01-01 DIAGNOSIS — E669 Obesity, unspecified: Secondary | ICD-10-CM | POA: Diagnosis not present

## 2021-01-01 DIAGNOSIS — O99213 Obesity complicating pregnancy, third trimester: Secondary | ICD-10-CM

## 2021-01-01 DIAGNOSIS — Z3A33 33 weeks gestation of pregnancy: Secondary | ICD-10-CM

## 2021-01-01 DIAGNOSIS — O09523 Supervision of elderly multigravida, third trimester: Secondary | ICD-10-CM

## 2021-01-01 DIAGNOSIS — Z2839 Other underimmunization status: Secondary | ICD-10-CM | POA: Diagnosis present

## 2021-01-01 DIAGNOSIS — O10013 Pre-existing essential hypertension complicating pregnancy, third trimester: Secondary | ICD-10-CM | POA: Diagnosis not present

## 2021-01-01 DIAGNOSIS — O4440 Low lying placenta NOS or without hemorrhage, unspecified trimester: Secondary | ICD-10-CM | POA: Insufficient documentation

## 2021-01-01 DIAGNOSIS — O09299 Supervision of pregnancy with other poor reproductive or obstetric history, unspecified trimester: Secondary | ICD-10-CM | POA: Diagnosis present

## 2021-01-01 DIAGNOSIS — Z3490 Encounter for supervision of normal pregnancy, unspecified, unspecified trimester: Secondary | ICD-10-CM

## 2021-01-01 DIAGNOSIS — O09213 Supervision of pregnancy with history of pre-term labor, third trimester: Secondary | ICD-10-CM

## 2021-01-01 DIAGNOSIS — O10912 Unspecified pre-existing hypertension complicating pregnancy, second trimester: Secondary | ICD-10-CM | POA: Diagnosis present

## 2021-01-07 ENCOUNTER — Ambulatory Visit (INDEPENDENT_AMBULATORY_CARE_PROVIDER_SITE_OTHER): Payer: 59 | Admitting: Obstetrics & Gynecology

## 2021-01-07 ENCOUNTER — Other Ambulatory Visit: Payer: Self-pay

## 2021-01-07 VITALS — BP 149/97 | HR 81 | Wt 209.0 lb

## 2021-01-07 DIAGNOSIS — O10919 Unspecified pre-existing hypertension complicating pregnancy, unspecified trimester: Secondary | ICD-10-CM

## 2021-01-07 DIAGNOSIS — Z3A34 34 weeks gestation of pregnancy: Secondary | ICD-10-CM

## 2021-01-07 DIAGNOSIS — O0990 Supervision of high risk pregnancy, unspecified, unspecified trimester: Secondary | ICD-10-CM

## 2021-01-07 MED ORDER — LABETALOL HCL 300 MG PO TABS: 300.0000 mg | ORAL_TABLET | Freq: Two times a day (BID) | ORAL | 1 refills | Status: DC

## 2021-01-07 NOTE — Progress Notes (Signed)
First BP 144/101. Repeat BP 149/97.  Pt states she had visual changes yesterday but, it has went away today. Pt states she had a headache earlier this morning but, it is gone now.     PRENATAL VISIT NOTE  Subjective:  Kara Brady is a 35 y.o. [phone] at [redacted]w[redacted]d being seen today for ongoing prenatal care.  She is currently monitored for the following issues for this high-risk pregnancy and has Bloodgood disease; Clinical depression; Anxiety; Migraine without aura; Hair loss; Supervision of high risk pregnancy, antepartum; Chronic hypertension affecting pregnancy; Hx of preeclampsia, prior pregnancy, currently pregnant; History of preterm delivery; Rubella non-immune status, antepartum; History of abnormal cervical Pap smear; Low-lying placenta; and Excess weight gain in pregnancy on their problem list.  Patient reports  patient had a headache and visual disturbances in the past 12 hours.  It is gone now.  Patient also states that blood pressure has been elevated the past week.  She takes it daily at work.  It is better on the weekends which are the values that she reports in Babys[employer]s.  Patient has a history of preeclampsia with a prior pregnancy. .  Contractions: Irritability. Vag. Bleeding: None.  Movement: Present. Denies leaking of fluid.   The following portions of the patient's history were reviewed and updated as appropriate: allergies, current medications, past family history, past medical history, past social history, past surgical history and problem list.   Objective:   Vitals:   01/07/21 0828 01/07/21 0833  BP: (!) 144/101 (!) 149/97  Pulse: 84 81  Weight: 209 lb (94.8 kg)     Fetal Status: Fetal Heart Rate (bpm): 143   Movement: Present     General:  Alert, oriented and cooperative. Patient is in no acute distress.  Skin: Skin is warm and dry. No rash noted.   Cardiovascular: Normal heart rate noted  Respiratory: Normal respiratory effort, no problems with respiration noted   Abdomen: Soft, gravid, appropriate for gestational age.  Pain/Pressure: Present     Pelvic: Cervical exam deferred        Extremities: Normal range of motion.  Edema: None  Mental Status: Normal mood and affect. Normal behavior. Normal judgment and thought content.   Assessment and Plan:  Pregnancy: [id number] at [redacted]w[redacted]d 1. Supervision of high risk pregnancy, antepartum  2. Chronic hypertension affecting pregnancy Will increase labetalol to 300 twice daily.  Patient to take blood pressure every day and placed in Babyscripts.  Check protein creatinine ratio, CBC and CMP.  Patient given strict eclampsia precautions.  Labs should return tomorrow.  If headache, visual disturbances, right upper quadrant pain, severe blood pressure of 160/110 occurs, patient knows to go to the MAU.  She has a biophysical profile tomorrow with MFM.  Given that her blood pressure is more elevated at work, I am writing her out on short-term disability.  I would like to see how she does when she is not working.  I want to avoid an unnecessary preterm delivery she can control her blood pressure by removing stressful work environment.  Preterm labor symptoms and general obstetric precautions including but not limited to vaginal bleeding, contractions, leaking of fluid and fetal movement were reviewed in detail with the patient. Please refer to After Visit Summary for other counseling recommendations.   RTC 1 week  Future Appointments  Date Time Provider Department Center  01/08/2021  7:30 AM [facility] NURSE [facility]  01/08/2021  7:45 AM [facility] US5 [facility]  01/15/2021  8:45 AM  [facility] NURSE [facility]  01/15/2021  9:00 AM [facility] US1 [facility]  01/22/2021  8:00 AM [facility] NURSE [facility]  01/22/2021  8:15 AM [facility] US2 [facility]  02/01/2021  9:15 AM [facility] NST [facility]    Elsie Lincoln, MD

## 2021-01-07 NOTE — Patient Instructions (Signed)
Take your BP every day and record in [employer].

## 2021-01-08 ENCOUNTER — Ambulatory Visit: Payer: 59 | Attending: Obstetrics

## 2021-01-08 ENCOUNTER — Ambulatory Visit: Payer: 59 | Admitting: *Deleted

## 2021-01-08 ENCOUNTER — Encounter: Payer: Self-pay | Admitting: *Deleted

## 2021-01-08 VITALS — BP 126/80 | HR 73

## 2021-01-08 DIAGNOSIS — Z8751 Personal history of pre-term labor: Secondary | ICD-10-CM | POA: Insufficient documentation

## 2021-01-08 DIAGNOSIS — Z3A34 34 weeks gestation of pregnancy: Secondary | ICD-10-CM

## 2021-01-08 DIAGNOSIS — O09523 Supervision of elderly multigravida, third trimester: Secondary | ICD-10-CM

## 2021-01-08 DIAGNOSIS — Z2839 Other underimmunization status: Secondary | ICD-10-CM | POA: Diagnosis present

## 2021-01-08 DIAGNOSIS — O4440 Low lying placenta NOS or without hemorrhage, unspecified trimester: Secondary | ICD-10-CM

## 2021-01-08 DIAGNOSIS — O10919 Unspecified pre-existing hypertension complicating pregnancy, unspecified trimester: Secondary | ICD-10-CM | POA: Diagnosis present

## 2021-01-08 DIAGNOSIS — O09899 Supervision of other high risk pregnancies, unspecified trimester: Secondary | ICD-10-CM

## 2021-01-08 DIAGNOSIS — O10913 Unspecified pre-existing hypertension complicating pregnancy, third trimester: Secondary | ICD-10-CM | POA: Diagnosis present

## 2021-01-08 DIAGNOSIS — O09299 Supervision of pregnancy with other poor reproductive or obstetric history, unspecified trimester: Secondary | ICD-10-CM | POA: Diagnosis present

## 2021-01-08 DIAGNOSIS — O10013 Pre-existing essential hypertension complicating pregnancy, third trimester: Secondary | ICD-10-CM | POA: Diagnosis not present

## 2021-01-08 DIAGNOSIS — O403XX0 Polyhydramnios, third trimester, not applicable or unspecified: Secondary | ICD-10-CM

## 2021-01-08 DIAGNOSIS — E669 Obesity, unspecified: Secondary | ICD-10-CM

## 2021-01-08 DIAGNOSIS — O0990 Supervision of high risk pregnancy, unspecified, unspecified trimester: Secondary | ICD-10-CM

## 2021-01-08 DIAGNOSIS — O09213 Supervision of pregnancy with history of pre-term labor, third trimester: Secondary | ICD-10-CM

## 2021-01-08 DIAGNOSIS — O99213 Obesity complicating pregnancy, third trimester: Secondary | ICD-10-CM

## 2021-01-08 LAB — CBC
HCT: 38.6 % (ref 35.0–45.0)
Hemoglobin: 13 g/dL (ref 11.7–15.5)
MCH: 30.4 pg (ref 27.0–33.0)
MCHC: 33.7 g/dL (ref 32.0–36.0)
MCV: 90.4 fL (ref 80.0–100.0)
MPV: 11.7 fL (ref 7.5–12.5)
Platelets: 249 10*3/uL (ref 140–400)
RBC: 4.27 10*6/uL (ref 3.80–5.10)
RDW: 14.4 % (ref 11.0–15.0)
WBC: 8.3 10*3/uL (ref 3.8–10.8)

## 2021-01-08 LAB — COMPREHENSIVE METABOLIC PANEL
AG Ratio: 1.2 (calc) (ref 1.0–2.5)
ALT: 13 U/L (ref 6–29)
AST: 15 U/L (ref 10–30)
Albumin: 3.2 g/dL — ABNORMAL LOW (ref 3.6–5.1)
Alkaline phosphatase (APISO): 74 U/L (ref 31–125)
BUN: 12 mg/dL (ref 7–25)
CO2: 22 mmol/L (ref 20–32)
Calcium: 9 mg/dL (ref 8.6–10.2)
Chloride: 106 mmol/L (ref 98–110)
Creat: 0.71 mg/dL (ref 0.50–0.97)
Globulin: 2.7 g/dL (calc) (ref 1.9–3.7)
Glucose, Bld: 86 mg/dL (ref 65–139)
Potassium: 4.5 mmol/L (ref 3.5–5.3)
Sodium: 137 mmol/L (ref 135–146)
Total Bilirubin: 0.3 mg/dL (ref 0.2–1.2)
Total Protein: 5.9 g/dL — ABNORMAL LOW (ref 6.1–8.1)

## 2021-01-08 LAB — PROTEIN / CREATININE RATIO, URINE
Creatinine, Urine: 42 mg/dL (ref 20–275)
Protein/Creat Ratio: 357 mg/g creat — ABNORMAL HIGH (ref 21–161)
Protein/Creatinine Ratio: 0.357 mg/mg creat — ABNORMAL HIGH (ref 0.021–0.161)
Total Protein, Urine: 15 mg/dL (ref 5–24)

## 2021-01-11 ENCOUNTER — Encounter (INDEPENDENT_AMBULATORY_CARE_PROVIDER_SITE_OTHER): Payer: Self-pay

## 2021-01-14 ENCOUNTER — Inpatient Hospital Stay (HOSPITAL_COMMUNITY)
Admission: AD | Admit: 2021-01-14 | Discharge: 2021-01-14 | Disposition: A | Discharge status: Home / Self Care | Payer: 59 | Attending: Obstetrics and Gynecology | Admitting: Obstetrics and Gynecology

## 2021-01-14 ENCOUNTER — Encounter: Payer: Self-pay | Admitting: Obstetrics and Gynecology

## 2021-01-14 ENCOUNTER — Ambulatory Visit (INDEPENDENT_AMBULATORY_CARE_PROVIDER_SITE_OTHER): Payer: 59 | Admitting: Obstetrics and Gynecology

## 2021-01-14 ENCOUNTER — Encounter (HOSPITAL_COMMUNITY): Payer: Self-pay | Admitting: Obstetrics and Gynecology

## 2021-01-14 ENCOUNTER — Other Ambulatory Visit: Payer: Self-pay

## 2021-01-14 ENCOUNTER — Other Ambulatory Visit: Payer: 59

## 2021-01-14 VITALS — BP 113/76 | HR 74 | Wt 211.0 lb

## 2021-01-14 DIAGNOSIS — O10919 Unspecified pre-existing hypertension complicating pregnancy, unspecified trimester: Secondary | ICD-10-CM

## 2021-01-14 DIAGNOSIS — O09299 Supervision of pregnancy with other poor reproductive or obstetric history, unspecified trimester: Secondary | ICD-10-CM

## 2021-01-14 DIAGNOSIS — O10013 Pre-existing essential hypertension complicating pregnancy, third trimester: Secondary | ICD-10-CM | POA: Diagnosis not present

## 2021-01-14 DIAGNOSIS — O0990 Supervision of high risk pregnancy, unspecified, unspecified trimester: Secondary | ICD-10-CM

## 2021-01-14 DIAGNOSIS — Z8751 Personal history of pre-term labor: Secondary | ICD-10-CM

## 2021-01-14 DIAGNOSIS — Z3A35 35 weeks gestation of pregnancy: Secondary | ICD-10-CM | POA: Insufficient documentation

## 2021-01-14 DIAGNOSIS — Z2839 Other underimmunization status: Secondary | ICD-10-CM

## 2021-01-14 DIAGNOSIS — O4703 False labor before 37 completed weeks of gestation, third trimester: Secondary | ICD-10-CM

## 2021-01-14 DIAGNOSIS — Z3689 Encounter for other specified antenatal screening: Secondary | ICD-10-CM

## 2021-01-14 DIAGNOSIS — O09899 Supervision of other high risk pregnancies, unspecified trimester: Secondary | ICD-10-CM

## 2021-01-14 LAB — COMPREHENSIVE METABOLIC PANEL
ALT: 17 U/L (ref 0–44)
AST: 25 U/L (ref 15–41)
Albumin: 2.6 g/dL — ABNORMAL LOW (ref 3.5–5.0)
Alkaline Phosphatase: 77 U/L (ref 38–126)
Anion gap: 9 (ref 5–15)
BUN: 7 mg/dL (ref 6–20)
CO2: 20 mmol/L — ABNORMAL LOW (ref 22–32)
Calcium: 9.7 mg/dL (ref 8.9–10.3)
Chloride: 104 mmol/L (ref 98–111)
Creatinine, Ser: 0.59 mg/dL (ref 0.44–1.00)
GFR, Estimated: >60 mL/min (ref >60)
Glucose, Bld: 90 mg/dL (ref 70–99)
Potassium: 4.3 mmol/L (ref 3.5–5.1)
Sodium: 133 mmol/L — ABNORMAL LOW (ref 135–145)
Total Bilirubin: 0.4 mg/dL (ref 0.3–1.2)
Total Protein: 6 g/dL — ABNORMAL LOW (ref 6.5–8.1)

## 2021-01-14 LAB — URINALYSIS, ROUTINE W REFLEX MICROSCOPIC
Bilirubin Urine: NEGATIVE
Glucose, UA: NEGATIVE mg/dL
Ketones, ur: NEGATIVE mg/dL
Nitrite: NEGATIVE
Protein, ur: 30 mg/dL — AB
Specific Gravity, Urine: 1.012 (ref 1.005–1.030)
pH: 7 (ref 5.0–8.0)

## 2021-01-14 LAB — PROTEIN / CREATININE RATIO, URINE
Creatinine, Urine: 68.32 mg/dL
Protein Creatinine Ratio: 0.44 mg/mg{Cre} — ABNORMAL HIGH (ref 0.00–0.15)
Total Protein, Urine: 30 mg/dL

## 2021-01-14 LAB — CBC
HCT: 40 % (ref 36.0–46.0)
Hemoglobin: 13.3 g/dL (ref 12.0–15.0)
MCH: 30 pg (ref 26.0–34.0)
MCHC: 33.3 g/dL (ref 30.0–36.0)
MCV: 90.3 fL (ref 80.0–100.0)
Platelets: 237 10*3/uL (ref 150–400)
RBC: 4.43 MIL/uL (ref 3.87–5.11)
RDW: 14.7 % (ref 11.5–15.5)
WBC: 8.3 10*3/uL (ref 4.0–10.5)
nRBC: 0 % (ref 0.0–0.2)

## 2021-01-14 MED ORDER — TERBUTALINE SULFATE 1 MG/ML IJ SOLN
0.2500 mg | Freq: Once | INTRAMUSCULAR | Status: AC
  Administered 2021-01-14: 0.25 mg via SUBCUTANEOUS
  Filled 2021-01-14: qty 1

## 2021-01-14 MED ORDER — FENTANYL CITRATE (PF) 100 MCG/2ML IJ SOLN
50.0000 ug | Freq: Once | INTRAMUSCULAR | Status: AC
  Administered 2021-01-14: 50 ug via INTRAVENOUS
  Filled 2021-01-14: qty 2

## 2021-01-14 MED ORDER — ACETAMINOPHEN 325 MG PO TABS
650.0000 mg | ORAL_TABLET | Freq: Once | ORAL | Status: AC
  Administered 2021-01-14: 650 mg via ORAL
  Filled 2021-01-14: qty 2

## 2021-01-14 MED ORDER — LACTATED RINGERS IV SOLN: Freq: Once | INTRAVENOUS | Status: AC

## 2021-01-14 MED ORDER — NIFEDIPINE 10 MG PO CAPS
10.0000 mg | ORAL_CAPSULE | ORAL | Status: AC | PRN
  Administered 2021-01-14 (×4): 10 mg via ORAL
  Filled 2021-01-14 (×4): qty 1

## 2021-01-14 NOTE — MAU Note (Signed)
Ctxs since 2330 that are getting closer. Denies LOF or VB. No recent sve.

## 2021-01-14 NOTE — Progress Notes (Signed)
Was seen in MAU overnite due to contractions.

## 2021-01-14 NOTE — MAU Provider Note (Addendum)
Chief Complaint:  Contractions and Hypertension (Took bp at home, 161/85/)   Event Date/Time   First Provider Initiated Contact with Patient 01/14/21 0316     HPI: Kara Brady is a 35 y.o. [id number] at 43w1dwho presents to maternity admissions reporting painful contractions and headache.  Has chronic hypertension and is on Labetalol for that. Has not taken anything for headache, which she has had all day. She reports good fetal movement, denies LOF, vaginal bleeding, vaginal itching/burning, urinary symptoms, dizziness, n/v, diarrhea, constipation or fever/chills.  She denies visual changes or RUQ abdominal pain.  Hypertension This is a recurrent problem. Associated symptoms include headaches. Treatments tried: LAbetalol. The current treatment provides mild improvement. There are no compliance problems.   Abdominal Pain This is a new problem. The current episode started today. The quality of the pain is cramping (COntractions). The abdominal pain does not radiate. Associated symptoms include headaches. Pertinent negatives include no constipation, diarrhea, dysuria, fever, myalgias, nausea or vomiting. The pain is relieved by Nothing. She has tried nothing for the symptoms.    Past Medical History: Past Medical History:  Diagnosis Date   Headache    History of migraine headaches    Hypertension     Past obstetric history: OB History  Gravida Para Term Preterm AB Living  4 2 1 1 1 2   SAB IAB Ectopic Multiple Live Births  1     0 2    # Outcome Date GA Lbr Len/2nd Weight Sex Delivery Anes PTL Lv  4 Current           3 Preterm 06/04/14 [redacted]w[redacted]d 02:10 / 00:01 2140 g M Vag-Spont None  LIV  2 Term 01/01/10 [redacted]w[redacted]d  3090 g M Vag-Spont   LIV  1 SAB             Past Surgical History: Past Surgical History:  Procedure Laterality Date   BREAST BIOPSY      Family History: Family History  Problem Relation Age of Onset   Hypertension Mother    Diabetes Paternal Grandfather     Social  History: Social History   Tobacco Use   Smoking status: Never   Smokeless tobacco: Never  Vaping Use   Vaping Use: Never used  Substance Use Topics   Alcohol use: No   Drug use: No    Allergies: No Known Allergies  Meds:  Medications Prior to Admission  Medication Sig Dispense Refill Last Dose   aspirin EC 81 MG tablet Take 1 tablet (81 mg total) by mouth daily. Take after 12 weeks for prevention of preeclampsia later in pregnancy 300 tablet 0 01/13/2021   labetalol (NORMODYNE) 300 MG tablet Take 1 tablet (300 mg total) by mouth 2 (two) times daily. 60 tablet 1 01/13/2021   Prenatal Vit-Fe Fumarate-FA (PRENATAL MULTIVITAMIN) TABS tablet Take 1 tablet by mouth daily at 12 noon.   01/13/2021    I have reviewed patient's Past Medical Hx, Surgical Hx, Family Hx, Social Hx, medications and allergies.   ROS:  Review of Systems  Constitutional:  Negative for fever.  Gastrointestinal:  Positive for abdominal pain. Negative for constipation, diarrhea, nausea and vomiting.  Genitourinary:  Negative for dysuria.  Musculoskeletal:  Negative for myalgias.  Neurological:  Positive for headaches.  Other systems negative  Physical Exam  Patient Vitals for the past 24 hrs:  BP Temp Pulse Resp Height Weight  01/14/21 0251 (!) 143/98 -- 79 -- -- --  01/14/21 0249 -- 97.9 F (36.6 C) --  16 5\' 2"  (1.575 m) 94.8 kg   Constitutional: Well-developed, well-nourished female in no acute distress.  Cardiovascular: normal rate and rhythm Respiratory: normal effort, clear to auscultation bilaterally GI: Abd soft, non-tender, gravid appropriate for gestational age.   No rebound or guarding. MS: Extremities nontender, no edema, normal ROM Neurologic: Alert and oriented x 4.  GU: Neg CVAT.  PELVIC EXAM:  Dilation: 2 Effacement (%): 50 Station: Ballotable Exam by:: [email or web address], CNM  FHT:  Baseline 140 , moderate variability, accelerations present, no decelerations Contractions: q 3 mins    Labs: Results for orders placed or performed during the hospital encounter of 01/14/21 (from the past 24 hour(s))  Urinalysis, Routine w reflex microscopic Urine, Clean Catch     Status: Abnormal   Collection Time: 01/14/21  2:51 AM  Result Value Ref Range   Color, Urine YELLOW YELLOW   APPearance HAZY (A) CLEAR   Specific Gravity, Urine 1.012 1.005 - 1.030   pH 7.0 5.0 - 8.0   Glucose, UA NEGATIVE NEGATIVE mg/dL   Hgb urine dipstick SMALL (A) NEGATIVE   Bilirubin Urine NEGATIVE NEGATIVE   Ketones, ur NEGATIVE NEGATIVE mg/dL   Protein, ur 30 (A) NEGATIVE mg/dL   Nitrite NEGATIVE NEGATIVE   Leukocytes,Ua TRACE (A) NEGATIVE   RBC / HPF 0-5 0 - 5 RBC/hpf   WBC, UA 0-5 0 - 5 WBC/hpf   Bacteria, UA RARE (A) NONE SEEN   Squamous Epithelial / LPF 11-20 0 - 5  Protein / creatinine ratio, urine     Status: Abnormal   Collection Time: 01/14/21  3:16 AM  Result Value Ref Range   Creatinine, Urine 68.32 mg/dL   Total Protein, Urine 30 mg/dL   Protein Creatinine Ratio 0.44 (H) 0.00 - 0.15 mg/mg[Cre]  CBC     Status: None   Collection Time: 01/14/21  3:36 AM  Result Value Ref Range   WBC 8.3 4.0 - 10.5 K/uL   RBC 4.43 3.87 - 5.11 MIL/uL   Hemoglobin 13.3 12.0 - 15.0 g/dL   HCT 01/16/21 74.2 - 59.5 %   MCV 90.3 80.0 - 100.0 fL   MCH 30.0 26.0 - 34.0 pg   MCHC 33.3 30.0 - 36.0 g/dL   RDW 63.8 75.6 - 43.3 %   Platelets 237 150 - 400 K/uL   nRBC 0.0 0.0 - 0.2 %  Comprehensive metabolic panel     Status: Abnormal   Collection Time: 01/14/21  3:36 AM  Result Value Ref Range   Sodium 133 (L) 135 - 145 mmol/L   Potassium 4.3 3.5 - 5.1 mmol/L   Chloride 104 98 - 111 mmol/L   CO2 20 (L) 22 - 32 mmol/L   Glucose, Bld 90 70 - 99 mg/dL   BUN 7 6 - 20 mg/dL   Creatinine, Ser 01/16/21 0.44 - 1.00 mg/dL   Calcium 9.7 8.9 - 1.88 mg/dL   Total Protein 6.0 (L) 6.5 - 8.1 g/dL   Albumin 2.6 (L) 3.5 - 5.0 g/dL   AST 25 15 - 41 U/L   ALT 17 0 - 44 U/L   Alkaline Phosphatase 77 38 - 126 U/L   Total  Bilirubin 0.4 0.3 - 1.2 mg/dL   GFR, Estimated 41.6 >60 mL/min   Anion gap 9 5 - 15   MAU Course/MDM: I have ordered labs and reviewed results. These are normal except elevated PrCrRatio.  NST reviewed, reassuring Headache was completely resolved with Tylenol Consult Dr >63 with presentation, exam findings and  test results.  Treatments in MAU included Procardia series (which did not stop contractions).  Terbutaline given which did not slow them either.  Will give a liter of fluid and analgesia . If this does not stop contractions, will admit for observation.    Report given to oncoming provider Wynelle Bourgeois CNM, MSN Certified Nurse-Midwife 01/14/2021  Reassessment: HA resolved. Ctx improved, now q2-6. Cervix unchanged. Stable for discharge home.   Assessment: 1. [redacted] weeks gestation of pregnancy   2. NST (non-stress test) reactive   3. Threatened premature labor in third trimester    Plan: Discharge home Follow [facility] as scheduled PTL precautions Pelvic rest  Allergies as of 01/14/2021   No Known Allergies      Medication List     TAKE these medications    aspirin EC 81 MG tablet Take 1 tablet (81 mg total) by mouth daily. Take after 12 weeks for prevention of preeclampsia later in pregnancy   labetalol 300 MG tablet Commonly known as: NORMODYNE Take 1 tablet (300 mg total) by mouth 2 (two) times daily.   prenatal multivitamin Tabs tablet Take 1 tablet by mouth daily at 12 noon.       Donette Larry, CNM  01/14/2021 8:50 AM

## 2021-01-14 NOTE — Progress Notes (Signed)
   PRENATAL VISIT NOTE  Subjective:  Kara Brady is a 35 y.o. [phone] at [redacted]w[redacted]d being seen today for ongoing prenatal care.  She is currently monitored for the following issues for this high-risk pregnancy and has Bloodgood disease; Clinical depression; Anxiety; Migraine without aura; Hair loss; Supervision of high risk pregnancy, antepartum; Chronic hypertension affecting pregnancy; Hx of preeclampsia, prior pregnancy, currently pregnant; History of preterm delivery; Rubella non-immune status, antepartum; History of abnormal cervical Pap smear; Low-lying placenta; and Excess weight gain in pregnancy on their problem list.  Patient reports  was seen in MAU this am for contractions but have spaced out now and she is feeling better .  Contractions: Not present. Vag. Bleeding: None.  Movement: Present. Denies leaking of fluid.   The following portions of the patient's history were reviewed and updated as appropriate: allergies, current medications, past family history, past medical history, past social history, past surgical history and problem list.   Objective:   Vitals:   01/14/21 1103  BP: 113/76  Pulse: 74  Weight: 211 lb (95.7 kg)    Fetal Status: Fetal Heart Rate (bpm): 144   Movement: Present     General:  Alert, oriented and cooperative. Patient is in no acute distress.  Skin: Skin is warm and dry. No rash noted.   Cardiovascular: Normal heart rate noted  Respiratory: Normal respiratory effort, no problems with respiration noted  Abdomen: Soft, gravid, appropriate for gestational age.  Pain/Pressure: Absent     Pelvic: Cervical exam deferred        Extremities: Normal range of motion.  Edema: None  Mental Status: Normal mood and affect. Normal behavior. Normal judgment and thought content.   Assessment and Plan:  Pregnancy: [id number] at [redacted]w[redacted]d  1. Chronic hypertension affecting pregnancy Cont baby asa Cont labetalol 300 mg BID  2. Supervision of high risk pregnancy,  antepartum  3. Hx of preeclampsia, prior pregnancy, currently pregnant BP normotensive today  4. History of preterm delivery 2/2 PEC  5. Rubella non-immune status, antepartum MMR pp   Preterm labor symptoms and general obstetric precautions including but not limited to vaginal bleeding, contractions, leaking of fluid and fetal movement were reviewed in detail with the patient. Please refer to After Visit Summary for other counseling recommendations.   Return in about 1 week (around 01/21/2021) for high OB, in person, 36 week swabs.  Future Appointments  Date Time Provider [place]  01/15/2021  8:45 AM [facility] NURSE [facility]  01/15/2021  9:00 AM [facility] US1 [facility]  01/21/2021 10:30 AM Constant, Peggy, MD [facility]  01/22/2021  8:00 AM [facility] NURSE [facility]  01/22/2021  8:15 AM [facility] US2 [facility]  02/01/2021  9:15 AM [facility] NST [facility] [place]    Sloan Leiter, MD

## 2021-01-15 ENCOUNTER — Ambulatory Visit: Payer: 59 | Admitting: *Deleted

## 2021-01-15 ENCOUNTER — Ambulatory Visit: Payer: 59 | Attending: Obstetrics and Gynecology

## 2021-01-15 VITALS — BP 121/85 | HR 68

## 2021-01-15 DIAGNOSIS — Z2839 Other underimmunization status: Secondary | ICD-10-CM | POA: Diagnosis present

## 2021-01-15 DIAGNOSIS — O10919 Unspecified pre-existing hypertension complicating pregnancy, unspecified trimester: Secondary | ICD-10-CM | POA: Diagnosis present

## 2021-01-15 DIAGNOSIS — Z8751 Personal history of pre-term labor: Secondary | ICD-10-CM

## 2021-01-15 DIAGNOSIS — O99213 Obesity complicating pregnancy, third trimester: Secondary | ICD-10-CM | POA: Diagnosis not present

## 2021-01-15 DIAGNOSIS — O09899 Supervision of other high risk pregnancies, unspecified trimester: Secondary | ICD-10-CM

## 2021-01-15 DIAGNOSIS — O4440 Low lying placenta NOS or without hemorrhage, unspecified trimester: Secondary | ICD-10-CM | POA: Insufficient documentation

## 2021-01-15 DIAGNOSIS — O403XX0 Polyhydramnios, third trimester, not applicable or unspecified: Secondary | ICD-10-CM

## 2021-01-15 DIAGNOSIS — O2603 Excessive weight gain in pregnancy, third trimester: Secondary | ICD-10-CM | POA: Diagnosis present

## 2021-01-15 DIAGNOSIS — O10913 Unspecified pre-existing hypertension complicating pregnancy, third trimester: Secondary | ICD-10-CM | POA: Insufficient documentation

## 2021-01-15 DIAGNOSIS — O0990 Supervision of high risk pregnancy, unspecified, unspecified trimester: Secondary | ICD-10-CM | POA: Insufficient documentation

## 2021-01-15 DIAGNOSIS — E669 Obesity, unspecified: Secondary | ICD-10-CM

## 2021-01-15 DIAGNOSIS — Z3A35 35 weeks gestation of pregnancy: Secondary | ICD-10-CM

## 2021-01-15 DIAGNOSIS — O10013 Pre-existing essential hypertension complicating pregnancy, third trimester: Secondary | ICD-10-CM

## 2021-01-15 DIAGNOSIS — O09213 Supervision of pregnancy with history of pre-term labor, third trimester: Secondary | ICD-10-CM

## 2021-01-15 DIAGNOSIS — O09299 Supervision of pregnancy with other poor reproductive or obstetric history, unspecified trimester: Secondary | ICD-10-CM | POA: Insufficient documentation

## 2021-01-15 DIAGNOSIS — O09523 Supervision of elderly multigravida, third trimester: Secondary | ICD-10-CM

## 2021-01-21 ENCOUNTER — Encounter: Payer: Self-pay | Admitting: Obstetrics and Gynecology

## 2021-01-21 ENCOUNTER — Ambulatory Visit (INDEPENDENT_AMBULATORY_CARE_PROVIDER_SITE_OTHER): Payer: 59 | Admitting: Obstetrics and Gynecology

## 2021-01-21 ENCOUNTER — Other Ambulatory Visit (HOSPITAL_COMMUNITY)
Admission: RE | Admit: 2021-01-21 | Discharge: 2021-01-21 | Disposition: A | Discharge status: Home / Self Care | Payer: 59 | Source: Ambulatory Visit | Attending: Obstetrics and Gynecology | Admitting: Obstetrics and Gynecology

## 2021-01-21 ENCOUNTER — Other Ambulatory Visit: Payer: Self-pay

## 2021-01-21 VITALS — BP 134/91 | HR 76 | Wt 211.0 lb

## 2021-01-21 DIAGNOSIS — O09299 Supervision of pregnancy with other poor reproductive or obstetric history, unspecified trimester: Secondary | ICD-10-CM

## 2021-01-21 DIAGNOSIS — O0993 Supervision of high risk pregnancy, unspecified, third trimester: Secondary | ICD-10-CM | POA: Diagnosis present

## 2021-01-21 DIAGNOSIS — O0990 Supervision of high risk pregnancy, unspecified, unspecified trimester: Secondary | ICD-10-CM

## 2021-01-21 DIAGNOSIS — O10919 Unspecified pre-existing hypertension complicating pregnancy, unspecified trimester: Secondary | ICD-10-CM

## 2021-01-21 NOTE — Progress Notes (Signed)
Pt denies headache/visual changes 

## 2021-01-21 NOTE — Progress Notes (Signed)
   PRENATAL VISIT NOTE  Subjective:  Kara Brady is a 35 y.o. [phone] at [redacted]w[redacted]d being seen today for ongoing prenatal care.  She is currently monitored for the following issues for this high-risk pregnancy and has Bloodgood disease; Clinical depression; Anxiety; Migraine without aura; Hair loss; Supervision of high risk pregnancy, antepartum; Chronic hypertension affecting pregnancy; Hx of preeclampsia, prior pregnancy, currently pregnant; History of preterm delivery; Rubella non-immune status, antepartum; History of abnormal cervical Pap smear; Low-lying placenta; and Excess weight gain in pregnancy on their problem list.  Patient reports no complaints.  Contractions: Not present. Vag. Bleeding: None.  Movement: Present. Denies leaking of fluid.   The following portions of the patient's history were reviewed and updated as appropriate: allergies, current medications, past family history, past medical history, past social history, past surgical history and problem list.   Objective:   Vitals:   01/21/21 1027  BP: (!) 134/91  Pulse: 76  Weight: 211 lb (95.7 kg)    Fetal Status: Fetal Heart Rate (bpm): 145 Fundal Height: 37 cm Movement: Present     General:  Alert, oriented and cooperative. Patient is in no acute distress.  Skin: Skin is warm and dry. No rash noted.   Cardiovascular: Normal heart rate noted  Respiratory: Normal respiratory effort, no problems with respiration noted  Abdomen: Soft, gravid, appropriate for gestational age.  Pain/Pressure: Absent     Pelvic: Cervical exam performed in the presence of a chaperone Dilation: 1 Effacement (%): Thick Station: -3  Extremities: Normal range of motion.  Edema: None  Mental Status: Normal mood and affect. Normal behavior. Normal judgment and thought content.   Assessment and Plan:  Pregnancy: [id number] at [redacted]w[redacted]d 1. Supervision of high risk pregnancy in third trimester Patient is doing well without complaints Cultures today - Culture,  beta strep (group b only) - Cervicovaginal ancillary only( [place])  2. Chronic hypertension affecting pregnancy Continue labetalol 300 BID Continue ASA Continue weekly BPP and growth ultrasound as scheduled by MFM   4. Hx of preeclampsia, prior pregnancy, currently pregnant Asymptomatic Precautions reviewed  Preterm labor symptoms and general obstetric precautions including but not limited to vaginal bleeding, contractions, leaking of fluid and fetal movement were reviewed in detail with the patient. Please refer to After Visit Summary for other counseling recommendations.   Return in about 1 week (around 01/28/2021) for in person, ROB, High risk.  Future Appointments  Date Time Provider Department Center  01/22/2021  8:00 AM [facility] NURSE [facility]  01/22/2021  8:15 AM [facility] US2 [facility]  02/01/2021  9:15 AM [facility] NST [facility]    Catalina Antigua, MD

## 2021-01-22 ENCOUNTER — Inpatient Hospital Stay (HOSPITAL_COMMUNITY)
Admission: AD | Admit: 2021-01-22 | Discharge: 2021-01-22 | Disposition: A | Discharge status: Home / Self Care | Payer: 59 | Attending: Obstetrics and Gynecology | Admitting: Obstetrics and Gynecology

## 2021-01-22 ENCOUNTER — Encounter (HOSPITAL_COMMUNITY): Payer: Self-pay | Admitting: Obstetrics and Gynecology

## 2021-01-22 ENCOUNTER — Ambulatory Visit (HOSPITAL_BASED_OUTPATIENT_CLINIC_OR_DEPARTMENT_OTHER): Payer: 59

## 2021-01-22 ENCOUNTER — Ambulatory Visit: Payer: 59 | Admitting: *Deleted

## 2021-01-22 ENCOUNTER — Other Ambulatory Visit: Payer: Self-pay

## 2021-01-22 ENCOUNTER — Encounter: Payer: Self-pay | Admitting: *Deleted

## 2021-01-22 VITALS — BP 164/93 | HR 66

## 2021-01-22 DIAGNOSIS — E669 Obesity, unspecified: Secondary | ICD-10-CM | POA: Diagnosis not present

## 2021-01-22 DIAGNOSIS — O10013 Pre-existing essential hypertension complicating pregnancy, third trimester: Secondary | ICD-10-CM

## 2021-01-22 DIAGNOSIS — O99213 Obesity complicating pregnancy, third trimester: Secondary | ICD-10-CM

## 2021-01-22 DIAGNOSIS — O09293 Supervision of pregnancy with other poor reproductive or obstetric history, third trimester: Secondary | ICD-10-CM | POA: Diagnosis not present

## 2021-01-22 DIAGNOSIS — O113 Pre-existing hypertension with pre-eclampsia, third trimester: Secondary | ICD-10-CM | POA: Diagnosis not present

## 2021-01-22 DIAGNOSIS — O09523 Supervision of elderly multigravida, third trimester: Secondary | ICD-10-CM

## 2021-01-22 DIAGNOSIS — O0990 Supervision of high risk pregnancy, unspecified, unspecified trimester: Secondary | ICD-10-CM | POA: Insufficient documentation

## 2021-01-22 DIAGNOSIS — Z8751 Personal history of pre-term labor: Secondary | ICD-10-CM | POA: Insufficient documentation

## 2021-01-22 DIAGNOSIS — Z2839 Other underimmunization status: Secondary | ICD-10-CM

## 2021-01-22 DIAGNOSIS — O26893 Other specified pregnancy related conditions, third trimester: Secondary | ICD-10-CM | POA: Insufficient documentation

## 2021-01-22 DIAGNOSIS — O403XX0 Polyhydramnios, third trimester, not applicable or unspecified: Secondary | ICD-10-CM

## 2021-01-22 DIAGNOSIS — O119 Pre-existing hypertension with pre-eclampsia, unspecified trimester: Secondary | ICD-10-CM | POA: Diagnosis present

## 2021-01-22 DIAGNOSIS — Z3A37 37 weeks gestation of pregnancy: Secondary | ICD-10-CM | POA: Insufficient documentation

## 2021-01-22 DIAGNOSIS — O09299 Supervision of pregnancy with other poor reproductive or obstetric history, unspecified trimester: Secondary | ICD-10-CM

## 2021-01-22 DIAGNOSIS — O10919 Unspecified pre-existing hypertension complicating pregnancy, unspecified trimester: Secondary | ICD-10-CM

## 2021-01-22 DIAGNOSIS — O4440 Low lying placenta NOS or without hemorrhage, unspecified trimester: Secondary | ICD-10-CM | POA: Insufficient documentation

## 2021-01-22 DIAGNOSIS — O09213 Supervision of pregnancy with history of pre-term labor, third trimester: Secondary | ICD-10-CM

## 2021-01-22 DIAGNOSIS — O10913 Unspecified pre-existing hypertension complicating pregnancy, third trimester: Secondary | ICD-10-CM

## 2021-01-22 DIAGNOSIS — Z3A36 36 weeks gestation of pregnancy: Secondary | ICD-10-CM | POA: Diagnosis not present

## 2021-01-22 LAB — URINALYSIS, ROUTINE W REFLEX MICROSCOPIC
Bilirubin Urine: NEGATIVE
Glucose, UA: NEGATIVE mg/dL
Ketones, ur: NEGATIVE mg/dL
Leukocytes,Ua: NEGATIVE
Nitrite: NEGATIVE
Protein, ur: NEGATIVE mg/dL
Specific Gravity, Urine: 1.002 — ABNORMAL LOW (ref 1.005–1.030)
pH: 7 (ref 5.0–8.0)

## 2021-01-22 LAB — COMPREHENSIVE METABOLIC PANEL
ALT: 23 U/L (ref 0–44)
AST: 31 U/L (ref 15–41)
Albumin: 2.7 g/dL — ABNORMAL LOW (ref 3.5–5.0)
Alkaline Phosphatase: 85 U/L (ref 38–126)
Anion gap: 8 (ref 5–15)
BUN: 9 mg/dL (ref 6–20)
CO2: 21 mmol/L — ABNORMAL LOW (ref 22–32)
Calcium: 10.2 mg/dL (ref 8.9–10.3)
Chloride: 105 mmol/L (ref 98–111)
Creatinine, Ser: 0.67 mg/dL (ref 0.44–1.00)
GFR, Estimated: >60 mL/min (ref >60)
Glucose, Bld: 75 mg/dL (ref 70–99)
Potassium: 4.6 mmol/L (ref 3.5–5.1)
Sodium: 134 mmol/L — ABNORMAL LOW (ref 135–145)
Total Bilirubin: 0.5 mg/dL (ref 0.3–1.2)
Total Protein: 6.2 g/dL — ABNORMAL LOW (ref 6.5–8.1)

## 2021-01-22 LAB — CERVICOVAGINAL ANCILLARY ONLY
Chlamydia: NEGATIVE
Comment: NEGATIVE
Comment: NORMAL
Neisseria Gonorrhea: NEGATIVE

## 2021-01-22 LAB — CBC
HCT: 41.4 % (ref 36.0–46.0)
Hemoglobin: 14 g/dL (ref 12.0–15.0)
MCH: 30.4 pg (ref 26.0–34.0)
MCHC: 33.8 g/dL (ref 30.0–36.0)
MCV: 89.8 fL (ref 80.0–100.0)
Platelets: 239 10*3/uL (ref 150–400)
RBC: 4.61 MIL/uL (ref 3.87–5.11)
RDW: 14.8 % (ref 11.5–15.5)
WBC: 8.7 10*3/uL (ref 4.0–10.5)
nRBC: 0 % (ref 0.0–0.2)

## 2021-01-22 NOTE — MAU Provider Note (Signed)
Chief Complaint  Patient presents with   Hypertension     Event Date/Time   First Provider Initiated Contact with Patient 01/22/21 1115      S: Kara Brady  is a 35 y.o. y.o. year old [phone] female at [redacted]w[redacted]d weeks gestation who presents to MAU with elevated blood pressures. Hx of chronic hypertension & preeclampsia in previous pregnancy. Current blood pressure medication: labetalol 300 mg BID. Hasn't missed doses. Was seen at MFM today & had a severe range BP. Repeat BPs in MFM were not severe.   Associated symptoms: Denies Headache, denies vision changes, denies epigastric pain Contractions: denies Vaginal bleeding: denies Fetal movement: normal  O: Patient Vitals for the past 24 hrs:  BP Temp Temp src Pulse Resp SpO2 Height Weight  01/22/21 1145 (!) 138/99 -- -- 68 -- -- -- --  01/22/21 1131 (!) 140/98 -- -- 72 -- -- -- --  01/22/21 1116 (!) 140/103 -- -- 65 -- -- -- --  01/22/21 1102 (!) 138/95 -- -- 73 -- -- -- --  01/22/21 1036 (!) 135/99 98.5 F (36.9 C) Oral 72 16 99 % 5\' 2"  (1.575 m) 95.7 kg   General: NAD Heart: Regular rate Lungs: Normal rate and effort Abd: Soft, NT, Gravid, S=D Extremities: No Pedal edema Neuro: 2+ deep tendon reflexes, No clonus  Fetal Tracing:  Baseline: 145 Variability: moderate Accelerations: 15x15 Decelerations: none  Toco: Q 10 minutes   Results for orders placed or performed during the hospital encounter of 01/22/21 (from the past 24 hour(s))  CBC     Status: None   Collection Time: 01/22/21 10:54 AM  Result Value Ref Range   WBC 8.7 4.0 - 10.5 K/uL   RBC 4.61 3.87 - 5.11 MIL/uL   Hemoglobin 14.0 12.0 - 15.0 g/dL   HCT 03/24/21 71.2 - 45.8 %   MCV 89.8 80.0 - 100.0 fL   MCH 30.4 26.0 - 34.0 pg   MCHC 33.8 30.0 - 36.0 g/dL   RDW 09.9 83.3 - 82.5 %   Platelets 239 150 - 400 K/uL   nRBC 0.0 0.0 - 0.2 %  Comprehensive metabolic panel     Status: Abnormal   Collection Time: 01/22/21 10:54 AM  Result Value Ref Range   Sodium 134 (L)  135 - 145 mmol/L   Potassium 4.6 3.5 - 5.1 mmol/L   Chloride 105 98 - 111 mmol/L   CO2 21 (L) 22 - 32 mmol/L   Glucose, Bld 75 70 - 99 mg/dL   BUN 9 6 - 20 mg/dL   Creatinine, Ser 03/24/21 0.44 - 1.00 mg/dL   Calcium 9.76 8.9 - 73.4 mg/dL   Total Protein 6.2 (L) 6.5 - 8.1 g/dL   Albumin 2.7 (L) 3.5 - 5.0 g/dL   AST 31 15 - 41 U/L   ALT 23 0 - 44 U/L   Alkaline Phosphatase 85 38 - 126 U/L   Total Bilirubin 0.5 0.3 - 1.2 mg/dL   GFR, Estimated 19.3 >79 mL/min   Anion gap 8 5 - 15    MDM Patient presents from MFM for evaluation of HTN. Has hx of severe preeclampsia in previous pregnancy resulting in a preterm IOL. She is chronic hypertensive & is currently taking labetalol 300 MG BID  - no missing doses.  Per review of records patient had new onset proteinuria as of 7/21 & again on 7/28 giving her the diagnosis of superimposed preeclampsia. Reviewed with Dr. 8/28 who agrees that patient should be  induced at 37 weeks for this diagnosis.  In MAU she has no severe range BPs and remains asymptomatic. Pre E labs are normal.   A:  1. Chronic hypertension with superimposed preeclampsia   2. [redacted] weeks gestation of pregnancy     P:  Discharge home in stable condition Reviewed s/s of severe preeclampsia Msg to [facility] to set up 37 wk induction  Judeth Horn, NP 01/22/2021 12:15 PM

## 2021-01-22 NOTE — MAU Note (Signed)
Sent from MFM, severe range BP today x1. Hx of CHTN, took BP meds this morning.  Denies HA, visual changes, epigastric pain or increase in swelling. Denies ROM or vag bleeding. Reports +FM.

## 2021-01-24 LAB — CULTURE, BETA STREP (GROUP B ONLY)
MICRO NUMBER:: 12205045
SPECIMEN QUALITY:: ADEQUATE

## 2021-01-25 ENCOUNTER — Encounter (HOSPITAL_COMMUNITY): Payer: Self-pay | Admitting: *Deleted

## 2021-01-25 ENCOUNTER — Encounter (HOSPITAL_COMMUNITY): Payer: Self-pay

## 2021-01-25 ENCOUNTER — Telehealth (HOSPITAL_COMMUNITY): Payer: Self-pay | Admitting: *Deleted

## 2021-01-25 ENCOUNTER — Telehealth: Payer: Self-pay | Admitting: *Deleted

## 2021-01-25 NOTE — Telephone Encounter (Signed)
Preadmission screen  

## 2021-01-25 NOTE — Telephone Encounter (Signed)
IOL form faxed to L&D for 37 weeks IOL due to superimposed PE per Erine Lawrence,NP.  Left message on pt's cell phone with details.

## 2021-01-26 ENCOUNTER — Other Ambulatory Visit: Payer: Self-pay | Admitting: Family Medicine

## 2021-01-26 LAB — SARS CORONAVIRUS 2 (TAT 6-24 HRS): SARS Coronavirus 2: NEGATIVE

## 2021-01-27 ENCOUNTER — Inpatient Hospital Stay (HOSPITAL_COMMUNITY)
Admission: AD | Admit: 2021-01-27 | Discharge: 2021-02-01 | DRG: 807 | Disposition: A | Discharge status: Home / Self Care | Payer: 59 | Attending: Obstetrics & Gynecology | Admitting: Obstetrics & Gynecology

## 2021-01-27 ENCOUNTER — Other Ambulatory Visit: Payer: Self-pay

## 2021-01-27 ENCOUNTER — Encounter (HOSPITAL_COMMUNITY): Payer: Self-pay | Admitting: Obstetrics and Gynecology

## 2021-01-27 DIAGNOSIS — Z8751 Personal history of pre-term labor: Secondary | ICD-10-CM

## 2021-01-27 DIAGNOSIS — O1002 Pre-existing essential hypertension complicating childbirth: Secondary | ICD-10-CM | POA: Diagnosis present

## 2021-01-27 DIAGNOSIS — Z3A00 Weeks of gestation of pregnancy not specified: Secondary | ICD-10-CM | POA: Diagnosis not present

## 2021-01-27 DIAGNOSIS — O10919 Unspecified pre-existing hypertension complicating pregnancy, unspecified trimester: Secondary | ICD-10-CM | POA: Diagnosis not present

## 2021-01-27 DIAGNOSIS — O09299 Supervision of pregnancy with other poor reproductive or obstetric history, unspecified trimester: Secondary | ICD-10-CM

## 2021-01-27 DIAGNOSIS — O4440 Low lying placenta NOS or without hemorrhage, unspecified trimester: Secondary | ICD-10-CM

## 2021-01-27 DIAGNOSIS — Z3A37 37 weeks gestation of pregnancy: Secondary | ICD-10-CM | POA: Diagnosis not present

## 2021-01-27 DIAGNOSIS — O0990 Supervision of high risk pregnancy, unspecified, unspecified trimester: Secondary | ICD-10-CM

## 2021-01-27 DIAGNOSIS — O114 Pre-existing hypertension with pre-eclampsia, complicating childbirth: Principal | ICD-10-CM | POA: Diagnosis present

## 2021-01-27 DIAGNOSIS — O119 Pre-existing hypertension with pre-eclampsia, unspecified trimester: Secondary | ICD-10-CM

## 2021-01-27 DIAGNOSIS — O2603 Excessive weight gain in pregnancy, third trimester: Secondary | ICD-10-CM

## 2021-01-27 DIAGNOSIS — Z2839 Other underimmunization status: Secondary | ICD-10-CM

## 2021-01-27 LAB — URINALYSIS, ROUTINE W REFLEX MICROSCOPIC
Bilirubin Urine: NEGATIVE
Glucose, UA: NEGATIVE mg/dL
Ketones, ur: NEGATIVE mg/dL
Leukocytes,Ua: NEGATIVE
Nitrite: NEGATIVE
Protein, ur: 100 mg/dL — AB
Specific Gravity, Urine: 1.012 (ref 1.005–1.030)
pH: 6 (ref 5.0–8.0)

## 2021-01-27 LAB — CBC
HCT: 41.3 % (ref 36.0–46.0)
Hemoglobin: 14.2 g/dL (ref 12.0–15.0)
MCH: 30.9 pg (ref 26.0–34.0)
MCHC: 34.4 g/dL (ref 30.0–36.0)
MCV: 90 fL (ref 80.0–100.0)
Platelets: 192 10*3/uL (ref 150–400)
RBC: 4.59 MIL/uL (ref 3.87–5.11)
RDW: 14.9 % (ref 11.5–15.5)
WBC: 11 10*3/uL — ABNORMAL HIGH (ref 4.0–10.5)
nRBC: 0 % (ref 0.0–0.2)

## 2021-01-27 LAB — COMPREHENSIVE METABOLIC PANEL
ALT: 171 U/L — ABNORMAL HIGH (ref 0–44)
AST: 204 U/L — ABNORMAL HIGH (ref 15–41)
Albumin: 2.8 g/dL — ABNORMAL LOW (ref 3.5–5.0)
Alkaline Phosphatase: 86 U/L (ref 38–126)
Anion gap: 9 (ref 5–15)
BUN: 15 mg/dL (ref 6–20)
CO2: 16 mmol/L — ABNORMAL LOW (ref 22–32)
Calcium: 9.2 mg/dL (ref 8.9–10.3)
Chloride: 108 mmol/L (ref 98–111)
Creatinine, Ser: 0.71 mg/dL (ref 0.44–1.00)
GFR, Estimated: >60 mL/min (ref >60)
Glucose, Bld: 94 mg/dL (ref 70–99)
Potassium: 5.1 mmol/L (ref 3.5–5.1)
Sodium: 133 mmol/L — ABNORMAL LOW (ref 135–145)
Total Bilirubin: 0.8 mg/dL (ref 0.3–1.2)
Total Protein: 6.3 g/dL — ABNORMAL LOW (ref 6.5–8.1)

## 2021-01-27 MED ORDER — SOD CITRATE-CITRIC ACID 500-334 MG/5ML PO SOLN: 30.0000 mL | ORAL | Status: DC | PRN

## 2021-01-27 MED ORDER — LIDOCAINE HCL (PF) 1 % IJ SOLN
30.0000 mL | INTRAMUSCULAR | Status: AC | PRN
  Administered 2021-01-28: 30 mL via SUBCUTANEOUS
  Filled 2021-01-27: qty 30

## 2021-01-27 MED ORDER — OXYTOCIN-SODIUM CHLORIDE 30-0.9 UT/500ML-% IV SOLN: 2.5000 [IU]/h | INTRAVENOUS | Status: DC

## 2021-01-27 MED ORDER — MAGNESIUM SULFATE 40 GM/1000ML IV SOLN
2.0000 g/h | INTRAVENOUS | Status: DC
  Administered 2021-01-27: 2 g/h via INTRAVENOUS

## 2021-01-27 MED ORDER — LABETALOL HCL 200 MG PO TABS
300.0000 mg | ORAL_TABLET | Freq: Two times a day (BID) | ORAL | Status: DC
  Administered 2021-01-27: 300 mg via ORAL
  Filled 2021-01-27: qty 1

## 2021-01-27 MED ORDER — OXYTOCIN-SODIUM CHLORIDE 30-0.9 UT/500ML-% IV SOLN
1.0000 m[IU]/min | INTRAVENOUS | Status: DC
Start: 2021-01-28 — End: 2021-01-28
  Administered 2021-01-27: 2 m[IU]/min via INTRAVENOUS
  Filled 2021-01-27: qty 500

## 2021-01-27 MED ORDER — LABETALOL HCL 5 MG/ML IV SOLN: 40.0000 mg | INTRAVENOUS | Status: DC | PRN

## 2021-01-27 MED ORDER — LACTATED RINGERS IV SOLN: 500.0000 mL | INTRAVENOUS | Status: DC | PRN

## 2021-01-27 MED ORDER — OXYCODONE-ACETAMINOPHEN 5-325 MG PO TABS: 2.0000 | ORAL_TABLET | ORAL | Status: DC | PRN

## 2021-01-27 MED ORDER — ACETAMINOPHEN 325 MG PO TABS: 650.0000 mg | ORAL_TABLET | ORAL | Status: DC | PRN

## 2021-01-27 MED ORDER — HYDRALAZINE HCL 20 MG/ML IJ SOLN
10.0000 mg | INTRAMUSCULAR | Status: DC | PRN
Start: 2021-01-27 — End: 2021-01-28

## 2021-01-27 MED ORDER — FENTANYL CITRATE (PF) 100 MCG/2ML IJ SOLN: 50.0000 ug | INTRAMUSCULAR | Status: DC | PRN

## 2021-01-27 MED ORDER — LABETALOL HCL 5 MG/ML IV SOLN
20.0000 mg | INTRAVENOUS | Status: DC | PRN
  Administered 2021-01-27: 20 mg via INTRAVENOUS
  Filled 2021-01-27: qty 4

## 2021-01-27 MED ORDER — HYDRALAZINE HCL 20 MG/ML IJ SOLN
10.0000 mg | INTRAMUSCULAR | Status: DC | PRN
Start: 2021-01-27 — End: 2021-01-27

## 2021-01-27 MED ORDER — MAGNESIUM SULFATE BOLUS VIA INFUSION
4.0000 g | Freq: Once | INTRAVENOUS | Status: AC
  Administered 2021-01-27: 4 g via INTRAVENOUS
  Filled 2021-01-27: qty 1000

## 2021-01-27 MED ORDER — OXYTOCIN BOLUS FROM INFUSION
333.0000 mL | Freq: Once | INTRAVENOUS | Status: AC
  Administered 2021-01-28: 333 mL via INTRAVENOUS

## 2021-01-27 MED ORDER — ONDANSETRON HCL 4 MG/2ML IJ SOLN: 4.0000 mg | Freq: Four times a day (QID) | INTRAMUSCULAR | Status: DC | PRN

## 2021-01-27 MED ORDER — LABETALOL HCL 5 MG/ML IV SOLN
INTRAVENOUS | Status: AC
  Filled 2021-01-27: qty 4

## 2021-01-27 MED ORDER — LABETALOL HCL 5 MG/ML IV SOLN: 80.0000 mg | INTRAVENOUS | Status: DC | PRN

## 2021-01-27 MED ORDER — MAGNESIUM SULFATE 40 GM/1000ML IV SOLN
INTRAVENOUS | Status: AC
  Filled 2021-01-27: qty 1000

## 2021-01-27 MED ORDER — LACTATED RINGERS IV SOLN: INTRAVENOUS | Status: DC

## 2021-01-27 MED ORDER — OXYCODONE-ACETAMINOPHEN 5-325 MG PO TABS: 1.0000 | ORAL_TABLET | ORAL | Status: DC | PRN

## 2021-01-27 MED ORDER — TERBUTALINE SULFATE 1 MG/ML IJ SOLN: 0.2500 mg | Freq: Once | INTRAMUSCULAR | Status: DC | PRN

## 2021-01-27 MED ORDER — LABETALOL HCL 5 MG/ML IV SOLN: 20.0000 mg | INTRAVENOUS | Status: DC | PRN

## 2021-01-27 NOTE — MAU Provider Note (Addendum)
Event Date/Time  First Provider Initiated Contact with Patient 01/27/21 1917      S Ms. Kara Brady is a 35 y.o. [phone] patient who presents to MAU today for evaluation of severe range blood pressures on her home cuff. Her pregnancy is complicated by Chronic Hypertension with Superimposed Preeclampsia. She endorses "floaters" which began while she was waiting in MAU registration. She also endorses intermittent RUQ pain, new onset this afternoon. She denies vaginal bleeding, leaking of fluid, decreased fetal movement, fever, falls, or recent illness.    O BP (!) 194/102 (BP Location: Right Arm)   Pulse (!) 51   Temp 98.5 F (36.9 C) (Oral)   Resp 16   LMP 05/13/2020   SpO2 98%    Physical Exam Vitals and nursing note reviewed. Exam conducted with a chaperone present.  Constitutional:      Appearance: Normal appearance.  Cardiovascular:     Rate and Rhythm: Normal rate.     Pulses: Normal pulses.  Pulmonary:     Effort: Pulmonary effort is normal.  Abdominal:     Comments: Gravid  Skin:    Capillary Refill: Capillary refill takes less than 2 seconds.  Neurological:     Mental Status: She is alert and oriented to person, place, and time.  Psychiatric:        Mood and Affect: Mood normal.        Behavior: Behavior normal.        Thought Content: Thought content normal.        Judgment: Judgment normal.    A Medical screening exam complete Chronic Hypertension with Superimposed Preeclampsia New onset severe features Cephalic presentation confirmed with bedside ultrasound  P Per Dr. Donavan Foil, admit to L&D Report given to Dr. Pecola Leisure Magnesium Sulfate initiated in MAU  Clayton Bibles, [facility] 01/27/2021 8:18 PM

## 2021-01-27 NOTE — H&P (Addendum)
OBSTETRIC ADMISSION HISTORY AND PHYSICAL  Kara Brady is a 35 y.o. female [phone] with IUP at [redacted]w[redacted]d by LMP presenting for IOL for cHTN SI PreE w/ SF. She noted elevated blood pressures at home today and was having floaters across her vision as well as RUQ pain. Has been having intermittent contractions for a few weeks. She reports +FMs, No LOF, no VB, headaches or peripheral edema. She plans on Breast feeding. She request POPs for birth control. She received her prenatal care at [facility].  Dating: By LMP --->  Estimated Date of Delivery: 02/17/21  Sono:    @[redacted]w[redacted]d , CWD, normal anatomy, Cephalic presentation, Posterior fundal placenta, 2664g, 51% EFW   Prenatal History/Complications:  -Chronic Hypertension on labetolol 300 BID -Hx PreEclampsia -Bloodgood disease -Depression/Anxiety -Migraine without aura -Rubella Non immune -Hx Preterm delivery  Past Medical History: Past Medical History:  Diagnosis Date   Headache    History of migraine headaches    Hypertension    Pregnancy induced hypertension     Past Surgical History: Past Surgical History:  Procedure Laterality Date   BREAST BIOPSY      Obstetrical History: OB History     Gravida  4   Para  2   Term  1   Preterm  1   AB  1   Living  2      SAB  1   IAB      Ectopic      Multiple  0   Live Births  2           Social History Social History   Socioeconomic History   Marital status: Married    Spouse name: Not on file   Number of children: Not on file   Years of education: Not on file   Highest education level: Not on file  Occupational History   Occupation: 11-09-1994: [employer]  Tobacco Use   Smoking status: Never   Smokeless tobacco: Never  Vaping Use   Vaping Use: Never used  Substance and Sexual Activity   Alcohol use: No   Drug use: No   Sexual activity: Yes    Partners: Male    Birth control/protection: None  Other Topics Concern   Not on file   Social History Narrative   Not on file   Social Determinants of Health   Financial Resource Strain: Not on file  Food Insecurity: Not on file  Transportation Needs: Not on file  Physical Activity: Not on file  Stress: Not on file  Social Connections: Not on file    Family History: Family History  Problem Relation Age of Onset   Hypertension Mother    Diabetes Paternal Grandfather     Allergies: No Known Allergies  Medications Prior to Admission  Medication Sig Dispense Refill Last Dose   aspirin EC 81 MG tablet Take 1 tablet (81 mg total) by mouth daily. Take after 12 weeks for prevention of preeclampsia later in pregnancy 300 tablet 0 01/27/2021   labetalol (NORMODYNE) 300 MG tablet Take 1 tablet (300 mg total) by mouth 2 (two) times daily. 60 tablet 1 01/27/2021   Prenatal Vit-Fe Fumarate-FA (PRENATAL MULTIVITAMIN) TABS tablet Take 1 tablet by mouth daily at 12 noon.   01/27/2021     Review of Systems   All systems reviewed and negative except as stated in HPI  Blood pressure 118/85, pulse 78, temperature 98.2 F (36.8 C), temperature source Oral, resp. rate 18,  last menstrual period 05/13/2020, SpO2 98 %. General appearance: alert, cooperative, and appears stated age Lungs: clear to auscultation bilaterally Heart: regular rate and rhythm Abdomen: soft, tender in RUQ/epigastric region without any rebound or guarding; bowel sounds normal Pelvic: 2/50/-2 Extremities: Homans sign is negative, no sign of DVT DTR's: Brisk bilaterally but no clonus Presentation: cephalic Fetal monitoringBaseline: 150 bpm, Variability: Good {> 6 bpm), Accelerations: Reactive, and Decelerations: Absent Uterine activity: irregular, q 2-4 min     Prenatal labs: ABO, Rh: --/--/O POS (08/10 1925) Antibody: NEG (08/10 1925) Rubella: 0.99 (03/10 0941) RPR: NON-REACTIVE (06/02 0841)  HBsAg: NON-REACTIVE (03/10 0941)  HIV: NON-REACTIVE (06/02 0841)  GBS:   GBS Negative 2 hr Glucola: 77,  126, 122 Genetic screening: Low Risk Anatomy US: Normal  Prenatal Transfer Tool  Maternal Diabetes: No Genetic Screening: Normal Maternal Ultrasounds/Referrals: Normal Fetal Ultrasounds or other Referrals:  Referred to Materal Fetal Medicine  Maternal Substance Abuse:  No Significant Maternal Medications:  Meds include: Other: Labetolol, ASA Significant Maternal Lab Results: Group B Strep negative  Results for orders placed or performed during the hospital encounter of 01/27/21 (from the past 24 hour(s))  CBC   Collection Time: 01/27/21  7:16 PM  Result Value Ref Range   WBC 11.0 (H) 4.0 - 10.5 K/uL   RBC 4.59 3.87 - 5.11 MIL/uL   Hemoglobin 14.2 12.0 - 15.0 g/dL   HCT 21.3 08.6 - 57.8 %   MCV 90.0 80.0 - 100.0 fL   MCH 30.9 26.0 - 34.0 pg   MCHC 34.4 30.0 - 36.0 g/dL   RDW 46.9 62.9 - 52.8 %   Platelets 192 150 - 400 K/uL   nRBC 0.0 0.0 - 0.2 %  Comprehensive metabolic panel   Collection Time: 01/27/21  7:16 PM  Result Value Ref Range   Sodium 133 (L) 135 - 145 mmol/L   Potassium 5.1 3.5 - 5.1 mmol/L   Chloride 108 98 - 111 mmol/L   CO2 16 (L) 22 - 32 mmol/L   Glucose, Bld 94 70 - 99 mg/dL   BUN 15 6 - 20 mg/dL   Creatinine, Ser 4.13 0.44 - 1.00 mg/dL   Calcium 9.2 8.9 - 24.4 mg/dL   Total Protein 6.3 (L) 6.5 - 8.1 g/dL   Albumin 2.8 (L) 3.5 - 5.0 g/dL   AST 010 (H) 15 - 41 U/L   ALT 171 (H) 0 - 44 U/L   Alkaline Phosphatase 86 38 - 126 U/L   Total Bilirubin 0.8 0.3 - 1.2 mg/dL   GFR, Estimated >27 >25 mL/min   Anion gap 9 5 - 15  Urinalysis, Routine w reflex microscopic Urine, Clean Catch   Collection Time: 01/27/21  7:25 PM  Result Value Ref Range   Color, Urine YELLOW YELLOW   APPearance HAZY (A) CLEAR   Specific Gravity, Urine 1.012 1.005 - 1.030   pH 6.0 5.0 - 8.0   Glucose, UA NEGATIVE NEGATIVE mg/dL   Hgb urine dipstick MODERATE (A) NEGATIVE   Bilirubin Urine NEGATIVE NEGATIVE   Ketones, ur NEGATIVE NEGATIVE mg/dL   Protein, ur 366 (A) NEGATIVE mg/dL    Nitrite NEGATIVE NEGATIVE   Leukocytes,Ua NEGATIVE NEGATIVE   RBC / HPF 0-5 0 - 5 RBC/hpf   WBC, UA 0-5 0 - 5 WBC/hpf   Bacteria, UA RARE (A) NONE SEEN   Squamous Epithelial / LPF 6-10 0 - 5   Mucus PRESENT   Type and screen [facility]   Collection Time:  01/27/21  7:25 PM  Result Value Ref Range   ABO/RH(D) O POS    Antibody Screen NEG    Sample Expiration      01/30/2021,2359 Performed at [facility], [address] [place], [place] [address]     Patient Active Problem List   Diagnosis Date Noted   Chronic hypertension with superimposed preeclampsia 01/22/2021   Low-lying placenta 12/11/2020   Excess weight gain in pregnancy 12/11/2020   History of abnormal cervical Pap smear 10/22/2020   Rubella non-immune status, antepartum 09/07/2020   Chronic hypertension affecting pregnancy 08/27/2020   Hx of preeclampsia, prior pregnancy, currently pregnant 08/27/2020   History of preterm delivery 08/27/2020   Supervision of high risk pregnancy, antepartum 07/28/2020   Hair loss 10/12/2016   Migraine without aura 03/25/2014   Clinical depression 11/14/2012   Anxiety 10/17/2012   Bloodgood disease 12/23/2011    Assessment/Plan:  Kara Brady is a 35 y.o. [id number] at [redacted]w[redacted]d here for IOL cHTN SI PreE w/ SF  #Labor: Start with pitocin 2x2 titrated per protocol #Pain: IV pain medicine PRN vs nitrous oxide #FWB: Cat 1 #ID: GBS negative #MOF: Breast #MOC: POPs #Circ:  Female #cHTN SI PreE w/ SF: Home meds labetolol 300mg  BID, developed RUQ pain, severe range BP and scotomas.   -Magnesium  -Labs q shift  -continue labetolol 300mg  BID, IV labetalol PRN  #Elevated LFTs: Sequela of severe SIPE above. Labs q shift  AST 204>  ALT 171>  , MD  01/27/2021, 8:57 PM  GME ATTESTATION:  I saw and evaluated the patient. I agree with the findings and the plan of care as documented in the resident's note.  Nelson Chimes, DO OB Fellow, Faculty  [facility], [facility] for [facility] 01/27/2021 10:43 PM

## 2021-01-27 NOTE — MAU Note (Signed)
Patient presented to MAU with elevated Bps that she checked at home with her BP cuff and machine provided by her provider. Patient reported Bps of 194-174/100s, that she checked three times. C/o of floaters and upper abd pain with pain rating of 6/10. Patient denies HA, DFM, VB, and LOF.

## 2021-01-28 ENCOUNTER — Inpatient Hospital Stay (HOSPITAL_COMMUNITY): Payer: 59

## 2021-01-28 ENCOUNTER — Other Ambulatory Visit: Payer: 59

## 2021-01-28 ENCOUNTER — Inpatient Hospital Stay (HOSPITAL_COMMUNITY): Admission: AD | Admit: 2021-01-28 | Payer: 59 | Source: Home / Self Care | Admitting: Family Medicine

## 2021-01-28 ENCOUNTER — Encounter: Payer: 59 | Admitting: Family Medicine

## 2021-01-28 ENCOUNTER — Encounter (HOSPITAL_COMMUNITY): Payer: Self-pay | Admitting: Family Medicine

## 2021-01-28 DIAGNOSIS — O114 Pre-existing hypertension with pre-eclampsia, complicating childbirth: Secondary | ICD-10-CM

## 2021-01-28 DIAGNOSIS — Z3A37 37 weeks gestation of pregnancy: Secondary | ICD-10-CM

## 2021-01-28 LAB — COMPREHENSIVE METABOLIC PANEL
ALT: 344 U/L — ABNORMAL HIGH (ref 0–44)
ALT: 332 U/L — ABNORMAL HIGH (ref 0–44)
ALT: 273 U/L — ABNORMAL HIGH (ref 0–44)
AST: 460 U/L — ABNORMAL HIGH (ref 15–41)
AST: 395 U/L — ABNORMAL HIGH (ref 15–41)
AST: 246 U/L — ABNORMAL HIGH (ref 15–41)
Albumin: 2.6 g/dL — ABNORMAL LOW (ref 3.5–5.0)
Albumin: 2.4 g/dL — ABNORMAL LOW (ref 3.5–5.0)
Albumin: 2.3 g/dL — ABNORMAL LOW (ref 3.5–5.0)
Alkaline Phosphatase: 85 U/L (ref 38–126)
Alkaline Phosphatase: 79 U/L (ref 38–126)
Alkaline Phosphatase: 72 U/L (ref 38–126)
Anion gap: 9 (ref 5–15)
Anion gap: 9 (ref 5–15)
Anion gap: 7 (ref 5–15)
BUN: 10 mg/dL (ref 6–20)
BUN: 8 mg/dL (ref 6–20)
BUN: 10 mg/dL (ref 6–20)
CO2: 17 mmol/L — ABNORMAL LOW (ref 22–32)
CO2: 18 mmol/L — ABNORMAL LOW (ref 22–32)
CO2: 19 mmol/L — ABNORMAL LOW (ref 22–32)
Calcium: 7.8 mg/dL — ABNORMAL LOW (ref 8.9–10.3)
Calcium: 7.1 mg/dL — ABNORMAL LOW (ref 8.9–10.3)
Calcium: 6.8 mg/dL — ABNORMAL LOW (ref 8.9–10.3)
Chloride: 103 mmol/L (ref 98–111)
Chloride: 103 mmol/L (ref 98–111)
Chloride: 102 mmol/L (ref 98–111)
Creatinine, Ser: 0.72 mg/dL (ref 0.44–1.00)
Creatinine, Ser: 0.69 mg/dL (ref 0.44–1.00)
Creatinine, Ser: 0.72 mg/dL (ref 0.44–1.00)
GFR, Estimated: >60 mL/min (ref >60)
GFR, Estimated: >60 mL/min (ref >60)
GFR, Estimated: >60 mL/min (ref >60)
Glucose, Bld: 142 mg/dL — ABNORMAL HIGH (ref 70–99)
Glucose, Bld: 128 mg/dL — ABNORMAL HIGH (ref 70–99)
Glucose, Bld: 155 mg/dL — ABNORMAL HIGH (ref 70–99)
Potassium: 3.9 mmol/L (ref 3.5–5.1)
Potassium: 4.2 mmol/L (ref 3.5–5.1)
Potassium: 4.1 mmol/L (ref 3.5–5.1)
Sodium: 129 mmol/L — ABNORMAL LOW (ref 135–145)
Sodium: 130 mmol/L — ABNORMAL LOW (ref 135–145)
Sodium: 128 mmol/L — ABNORMAL LOW (ref 135–145)
Total Bilirubin: 0.5 mg/dL (ref 0.3–1.2)
Total Bilirubin: 0.6 mg/dL (ref 0.3–1.2)
Total Bilirubin: 0.8 mg/dL (ref 0.3–1.2)
Total Protein: 6 g/dL — ABNORMAL LOW (ref 6.5–8.1)
Total Protein: 5.6 g/dL — ABNORMAL LOW (ref 6.5–8.1)
Total Protein: 5.5 g/dL — ABNORMAL LOW (ref 6.5–8.1)

## 2021-01-28 LAB — CBC
HCT: 39.1 % (ref 36.0–46.0)
HCT: 36.7 % (ref 36.0–46.0)
HCT: 33.6 % — ABNORMAL LOW (ref 36.0–46.0)
Hemoglobin: 13.3 g/dL (ref 12.0–15.0)
Hemoglobin: 12.4 g/dL (ref 12.0–15.0)
Hemoglobin: 11.5 g/dL — ABNORMAL LOW (ref 12.0–15.0)
MCH: 30.6 pg (ref 26.0–34.0)
MCH: 30.7 pg (ref 26.0–34.0)
MCH: 30.9 pg (ref 26.0–34.0)
MCHC: 34 g/dL (ref 30.0–36.0)
MCHC: 33.8 g/dL (ref 30.0–36.0)
MCHC: 34.2 g/dL (ref 30.0–36.0)
MCV: 89.9 fL (ref 80.0–100.0)
MCV: 90.8 fL (ref 80.0–100.0)
MCV: 90.3 fL (ref 80.0–100.0)
Platelets: 132 10*3/uL — ABNORMAL LOW (ref 150–400)
Platelets: 100 10*3/uL — ABNORMAL LOW (ref 150–400)
Platelets: 124 10*3/uL — ABNORMAL LOW (ref 150–400)
RBC: 4.35 MIL/uL (ref 3.87–5.11)
RBC: 4.04 MIL/uL (ref 3.87–5.11)
RBC: 3.72 MIL/uL — ABNORMAL LOW (ref 3.87–5.11)
RDW: 15.2 % (ref 11.5–15.5)
RDW: 15.2 % (ref 11.5–15.5)
RDW: 15.3 % (ref 11.5–15.5)
WBC: 9.6 10*3/uL (ref 4.0–10.5)
WBC: 8.6 10*3/uL (ref 4.0–10.5)
WBC: 15.2 10*3/uL — ABNORMAL HIGH (ref 4.0–10.5)
nRBC: 0 % (ref 0.0–0.2)
nRBC: 0.2 % (ref 0.0–0.2)
nRBC: 0.1 % (ref 0.0–0.2)

## 2021-01-28 LAB — RPR: RPR Ser Ql: NONREACTIVE

## 2021-01-28 LAB — PREPARE RBC (CROSSMATCH)

## 2021-01-28 LAB — MAGNESIUM
Magnesium: 5.6 mg/dL — ABNORMAL HIGH (ref 1.7–2.4)
Magnesium: 6 mg/dL — ABNORMAL HIGH (ref 1.7–2.4)

## 2021-01-28 MED ORDER — COCONUT OIL OIL: 1.0000 "application " | TOPICAL_OIL | Status: DC | PRN

## 2021-01-28 MED ORDER — ACETAMINOPHEN 325 MG PO TABS: 650.0000 mg | ORAL_TABLET | ORAL | Status: DC | PRN

## 2021-01-28 MED ORDER — DIBUCAINE (PERIANAL) 1 % EX OINT: 1.0000 "application " | TOPICAL_OINTMENT | CUTANEOUS | Status: DC | PRN

## 2021-01-28 MED ORDER — TRANEXAMIC ACID-NACL 1000-0.7 MG/100ML-% IV SOLN
INTRAVENOUS | Status: AC
  Filled 2021-01-28: qty 100

## 2021-01-28 MED ORDER — SODIUM CHLORIDE 0.9% IV SOLUTION: Freq: Once | INTRAVENOUS | Status: DC

## 2021-01-28 MED ORDER — ONDANSETRON HCL 4 MG PO TABS: 4.0000 mg | ORAL_TABLET | ORAL | Status: DC | PRN

## 2021-01-28 MED ORDER — DIPHENHYDRAMINE HCL 25 MG PO CAPS: 25.0000 mg | ORAL_CAPSULE | Freq: Four times a day (QID) | ORAL | Status: DC | PRN

## 2021-01-28 MED ORDER — OXYCODONE HCL 5 MG PO TABS: 10.0000 mg | ORAL_TABLET | ORAL | Status: DC | PRN

## 2021-01-28 MED ORDER — BENZOCAINE-MENTHOL 20-0.5 % EX AERO
1.0000 "application " | INHALATION_SPRAY | CUTANEOUS | Status: DC | PRN
  Administered 2021-01-30: 1 via TOPICAL
  Filled 2021-01-28: qty 56

## 2021-01-28 MED ORDER — TETANUS-DIPHTH-ACELL PERTUSSIS 5-2.5-18.5 LF-MCG/0.5 IM SUSY: 0.5000 mL | PREFILLED_SYRINGE | Freq: Once | INTRAMUSCULAR | Status: DC

## 2021-01-28 MED ORDER — IBUPROFEN 600 MG PO TABS: 600.0000 mg | ORAL_TABLET | Freq: Four times a day (QID) | ORAL | Status: DC

## 2021-01-28 MED ORDER — SENNOSIDES-DOCUSATE SODIUM 8.6-50 MG PO TABS
2.0000 | ORAL_TABLET | Freq: Every day | ORAL | Status: DC
  Administered 2021-01-29 – 2021-02-01 (×4): 2 via ORAL
  Filled 2021-01-28 (×4): qty 2

## 2021-01-28 MED ORDER — TRANEXAMIC ACID-NACL 1000-0.7 MG/100ML-% IV SOLN
1000.0000 mg | INTRAVENOUS | Status: AC
  Administered 2021-01-28: 1000 mg via INTRAVENOUS

## 2021-01-28 MED ORDER — WITCH HAZEL-GLYCERIN EX PADS: 1.0000 "application " | MEDICATED_PAD | CUTANEOUS | Status: DC | PRN

## 2021-01-28 MED ORDER — PRENATAL MULTIVITAMIN CH
1.0000 | ORAL_TABLET | Freq: Every day | ORAL | Status: DC
  Administered 2021-01-28 – 2021-02-01 (×5): 1 via ORAL
  Filled 2021-01-28 (×5): qty 1

## 2021-01-28 MED ORDER — LACTATED RINGERS IV SOLN: INTRAVENOUS | Status: DC

## 2021-01-28 MED ORDER — ONDANSETRON HCL 4 MG/2ML IJ SOLN: 4.0000 mg | INTRAMUSCULAR | Status: DC | PRN

## 2021-01-28 MED ORDER — SIMETHICONE 80 MG PO CHEW: 80.0000 mg | CHEWABLE_TABLET | ORAL | Status: DC | PRN

## 2021-01-28 MED ORDER — OXYCODONE HCL 5 MG PO TABS
5.0000 mg | ORAL_TABLET | ORAL | Status: DC | PRN
  Administered 2021-01-29 – 2021-01-31 (×5): 5 mg via ORAL
  Filled 2021-01-28 (×5): qty 1

## 2021-01-28 MED ORDER — MAGNESIUM SULFATE 40 GM/1000ML IV SOLN
2.0000 g/h | INTRAVENOUS | Status: AC
  Administered 2021-01-28: 2 g/h via INTRAVENOUS
  Filled 2021-01-28: qty 1000

## 2021-01-28 NOTE — Lactation Note (Signed)
This note was copied from a baby's chart. Lactation Consultation Note  Patient Name: Kara Brady Date: 01/28/2021 Reason for consult: Initial assessment;Mother's request;Difficult latch;Early term 37-38.6wks;Other (Comment) (PIH Labetalol L2, Hyralizine L2 and Mg) Age:36 hours Mom feeding plan to work on latching and supplement with EBM/formula if needed.   Infant not able to latch at this feeding. Mom to pump and offer EBM via spoon.   Mom small short shafted nipples. We worked on latching in laid back position. If continue to struggle, Mom need a 20 NS.  Plan 1 To feed based on cues 8-12x in24 hr period. Mom to offer breasts first and if not able to latch, hand express and give EBM via spoon.  2. Mom wants to try breastfeeding for now. If needed, breast/formula and will supplement with pace bottle feeding using slow flow nipple. BF supplementation guide provided.  3. DEBP q 3 hrs for 15 min  4 I and O sheet reviewed.  5 LC brochure of inpatient and outpatient services reviewed.  All questions answered at the end of the visit.  Mom to call for latch assistance with next feeding.   Maternal Data Has patient been taught Hand Expression?: Yes Does the patient have breastfeeding experience prior to this delivery?: Yes How long did the patient breastfeed?: pumped and bottle fed for 1 month not able to get a latch  Feeding Mother's Current Feeding Choice: Breast Milk and Formula  LATCH Score    Lactation Tools Discussed/Used Tools: Pump;Flanges Flange Size: 21 Breast pump type: Double-Electric Breast Pump Pump Education: Setup, frequency, and cleaning Reason for Pumping: increase stimulation Pumping frequency: every 3 hrs for 15 min  Interventions Interventions: Breast feeding basics reviewed;Breast compression;Assisted with latch;Adjust position;Skin to skin;Support pillows;DEBP;Breast massage;Position options;Hand express;Expressed milk;Pre-pump if  needed  Discharge Pump: Manual  Consult Status Consult Status: Follow-up Date: 01/29/21 Follow-up type: In-patient    Keeshawn Fakhouri  Nicholson-Springer 01/28/2021, 3:16 PM

## 2021-01-28 NOTE — Discharge Summary (Signed)
Postpartum Discharge Summary  Date of Service updated     Patient Name: Kara Brady DOB: 1985-10-22 MRN: [id number]  Date of admission: 01/27/2021 Delivery date:01/28/2021  Delivering provider: Elvera Maria  Date of discharge: [id number]  Admitting diagnosis: Indication for care in labor or delivery [O75.9] Intrauterine pregnancy: [redacted]w[redacted]d    Secondary diagnosis:  Active Problems:   Indication for care in labor or delivery  Additional problems:     Discharge diagnosis: Term Pregnancy Delivered                                              Post partum procedures: magnesium Augmentation: AROM and Pitocin Complications: None  Hospital course: Induction of Labor With Vaginal Delivery   35y.o. yo [id number] [id number] admitted to the hospital 01/27/2021 for induction of labor.  Indication for induction: Preeclampsia.  Patient had an uncomplicated labor course as follows: Membrane Rupture Time/Date: 7:44 AM ,01/28/2021   Delivery Method:Vaginal, Spontaneous  Episiotomy: None  Lacerations:  1st degree;Perineal  Details of delivery can be found in separate delivery note.  Patient had a routine postpartum course. Patient is discharged home 02/02/21.  Newborn Data: Birth date:01/28/2021  Birth time:8:40 AM  Gender:Female  Living status:Living  Apgars:8 ,9  Weight:2855 g   Magnesium Sulfate received: No BMZ received: No Rhophylac:No MMR:Yes T-DaP: declined Flu: No Transfusion:No  Physical exam  Vitals:   01/31/21 1929 01/31/21 2332 02/01/21 0320 02/01/21 0801  BP: (!) 140/93 125/84 132/85 (!) 133/95  Pulse: 97 99 94 87  Resp: '18 18 18 18  ' Temp: 98.4 F (36.9 C) 98 F (36.7 C) 97.9 F (36.6 C) 98 F (36.7 C)  TempSrc: Oral Oral Oral Oral  SpO2: 98% 100% 99% 98%  Weight:      Height:       General: alert, cooperative, and no distress Lochia: appropriate Uterine Fundus: firm Incision: N/A DVT Evaluation: No evidence of DVT seen on physical exam. Labs: Lab  Results  Component Value Date   WBC 13.4 (H) 01/30/2021   HGB 10.6 (L) 01/30/2021   HCT 31.9 (L) 01/30/2021   MCV 93.0 01/30/2021   PLT 126 (L) 01/30/2021   CMP Latest Ref Rng & Units 01/30/2021  Glucose 70 - 99 mg/dL 89  BUN 6 - 20 mg/dL 10  Creatinine 0.44 - 1.00 mg/dL 0.67  Sodium 135 - 145 mmol/L 136  Potassium 3.5 - 5.1 mmol/L 4.2  Chloride 98 - 111 mmol/L 106  CO2 22 - 32 mmol/L 23  Calcium 8.9 - 10.3 mg/dL 8.1(L)  Total Protein 6.5 - 8.1 g/dL 5.2(L)  Total Bilirubin 0.3 - 1.2 mg/dL 0.5  Alkaline Phos 38 - 126 U/L 57  AST 15 - 41 U/L 56(H)  ALT 0 - 44 U/L 145(H)   EdFlavia Shippercore: Edinburgh Postnatal Depression Scale Screening Tool 01/29/2021  I have been able to laugh and see the funny side of things. 0  I have looked forward with enjoyment to things. 1  I have blamed myself unnecessarily when things went wrong. 1  I have been anxious or worried for no good reason. 2  I have felt scared or panicky for no good reason. 2  Things have been getting on top of me. 1  I have been so unhappy that I have had difficulty sleeping. 0  I have felt  sad or miserable. 0  I have been so unhappy that I have been crying. 1  The thought of harming myself has occurred to me. 0  Edinburgh Postnatal Depression Scale Total 8     After visit meds:  Allergies as of 02/01/2021   No Known Allergies      Medication List     STOP taking these medications    labetalol 300 MG tablet Commonly known as: NORMODYNE       TAKE these medications    ASPIRIN 81 PO Take 81 mg by mouth daily.   hydrochlorothiazide 25 MG tablet Commonly known as: HYDRODIURIL Take 1 tablet (25 mg total) by mouth daily.   ibuprofen 800 MG tablet Commonly known as: ADVIL Take 1 tablet (800 mg total) by mouth every 8 (eight) hours as needed.   NIFEdipine 60 MG 24 hr tablet Commonly known as: ADALAT CC Take 1 tablet (60 mg total) by mouth daily.   Oxycodone HCl 10 MG Tabs Take 1 tablet (10 mg total) by  mouth every 4 (four) hours as needed for severe pain or breakthrough pain.   PRENATAL PO Take 1 tablet by mouth daily.         Discharge home in stable condition Infant Feeding: Breast Infant Disposition:home with mother Discharge instruction: per After Visit Summary and Postpartum booklet. Activity: Advance as tolerated. Pelvic rest for 6 weeks.  Diet: routine diet Future Appointments: No future appointments.  Follow up Visit:  [place] [facility] [place] [facility] [place] Follow up on 02/05/2021.   Specialty: Obstetrics and Gynecology Why: BP check Contact information: [place], [place] [phone]                Message sent to [facility] on 01/28/21:  Please schedule this patient for a In person postpartum visit in 4 weeks with the following provider: MD. Additional Postpartum F/U:BP check 1 week  High risk pregnancy complicated by: HTN Delivery mode:  Vaginal, Spontaneous  Anticipated Birth Control:  POPs   02/02/2021 Florian Buff, MD

## 2021-01-28 NOTE — Progress Notes (Signed)
Labor Progress Note Kara Brady is a 35 y.o. [id number] at [redacted]w[redacted]d presented for IOL due to cHTN with SIPE/severe features.   S: Doing well, not feeling the contractions that much.   O:  BP 121/85   Pulse (!) 56   Temp 98.2 F (36.8 C) (Oral)   Resp 18   LMP 05/13/2020   SpO2 98%  EFM: 140-145/mod var/ no accel/no decels  CVE: Dilation: 4 Effacement (%): 60 Station: -2 Presentation: Vertex Exam by:: Dr. Annia Friendly   A&P: 35 y.o. [id number] [redacted]w[redacted]d  #Labor: Progressing well with pit 2x2, currently at 8. Continue to titrate as needed. Likely AROM on next check.  #Pain: PRN  #FWB: Cat 1  #GBS negative  #cHTN with severe SIPE: Stable currently, RUQ pain improving. Cont labetalol 300mg  BID with PRN and Mg. Labs q8.    , DO 3:19 AM

## 2021-01-28 NOTE — Progress Notes (Signed)
Labor Progress Note Kara Brady is a 35 y.o. [phone] at [redacted]w[redacted]d presented for  IOL due to cHTN with SIPE/severe features S:  Doing well, not feeling her contractions much. Denies blurry vision, chest pain, SOB. Her RUQ pain has not worsened. Discussed potential need for blood transfusion with patient and cross matching 2 units. Patient consents to blood transfusion if required  O:  BP (!) 144/95   Pulse 67   Temp 98.2 F (36.8 C) (Oral)   Resp 16   LMP 05/13/2020   SpO2 98%  EFM: 135bpm/periods of moderate variability, mainly minimal/no accels since 0630, no decesl  CVE: Dilation: 5.5 Effacement (%): 60 Station: -2 Presentation: Vertex Exam by:: Nelson Chimes, Resident   A&P: 35 y.o. [id number] [redacted]w[redacted]d IOL cHTN with SIPE/severe features #Labor: Progressing well. Continue pitocin titrated per protocol. AROM clr fluid 0745 #Pain: PRN #FWB: Cat 1, still reassuring but not reactive #GBS negative #cHTN with severe SIPE: worsening currently, RUQ pain improving. Cont labetalol 300mg  BID with PRN and Mg. Labs q6  -Transaminitis: AST 204>460, ALT 171>344  -Platelets: 192>132  -Cross match 2 units  , MD 7:50 AM

## 2021-01-29 LAB — COMPREHENSIVE METABOLIC PANEL
ALT: 207 U/L — ABNORMAL HIGH (ref 0–44)
AST: 121 U/L — ABNORMAL HIGH (ref 15–41)
Albumin: 2.3 g/dL — ABNORMAL LOW (ref 3.5–5.0)
Alkaline Phosphatase: 71 U/L (ref 38–126)
Anion gap: 7 (ref 5–15)
BUN: 11 mg/dL (ref 6–20)
CO2: 21 mmol/L — ABNORMAL LOW (ref 22–32)
Calcium: 6.5 mg/dL — ABNORMAL LOW (ref 8.9–10.3)
Chloride: 104 mmol/L (ref 98–111)
Creatinine, Ser: 0.7 mg/dL (ref 0.44–1.00)
GFR, Estimated: >60 mL/min (ref >60)
Glucose, Bld: 104 mg/dL — ABNORMAL HIGH (ref 70–99)
Potassium: 3.7 mmol/L (ref 3.5–5.1)
Sodium: 132 mmol/L — ABNORMAL LOW (ref 135–145)
Total Bilirubin: 0.2 mg/dL — ABNORMAL LOW (ref 0.3–1.2)
Total Protein: 5.2 g/dL — ABNORMAL LOW (ref 6.5–8.1)

## 2021-01-29 LAB — CBC
HCT: 30.9 % — ABNORMAL LOW (ref 36.0–46.0)
Hemoglobin: 10.7 g/dL — ABNORMAL LOW (ref 12.0–15.0)
MCH: 31 pg (ref 26.0–34.0)
MCHC: 34.6 g/dL (ref 30.0–36.0)
MCV: 89.6 fL (ref 80.0–100.0)
Platelets: 124 10*3/uL — ABNORMAL LOW (ref 150–400)
RBC: 3.45 MIL/uL — ABNORMAL LOW (ref 3.87–5.11)
RDW: 15.6 % — ABNORMAL HIGH (ref 11.5–15.5)
WBC: 16.4 10*3/uL — ABNORMAL HIGH (ref 4.0–10.5)
nRBC: 0 % (ref 0.0–0.2)

## 2021-01-29 LAB — MAGNESIUM: Magnesium: 5.9 mg/dL — ABNORMAL HIGH (ref 1.7–2.4)

## 2021-01-29 NOTE — Progress Notes (Signed)
Post Partum Day 1 s/p VD   at [redacted]w[redacted]d after IOL for CHTN with superimposed severe PEC/HELLP Subjective: No complaints, up ad lib, voiding, and tolerating PO.  Patient denies any headaches, visual symptoms, RUQ/epigastric pain or other concerning symptoms. Baby girl is table at bedside, breastfeeding. Reports normal lochia.  Objective: Blood pressure 115/84, pulse 84, temperature 97.7 F (36.5 C), temperature source Oral, resp. rate 18, height 5\' 2"  (1.575 m), weight 95.7 kg, last menstrual period 05/13/2020, SpO2 98 %, unknown if currently breastfeeding.  Physical Exam:  General: alert and no distress Lochia: appropriate Uterine Fundus: firm DVT Evaluation: No evidence of DVT seen on physical exam. Negative Homan's sign. No significant calf/ankle edema.  CMP Latest Ref Rng & Units 01/29/2021 01/28/2021 01/28/2021  Glucose 70 - 99 mg/dL 03/30/2021) 144(Y) 185(U)  BUN 6 - 20 mg/dL 11 10 8   Creatinine 0.44 - 1.00 mg/dL 314(H 7.02  Sodium 135 - 145 mmol/L 132(L) 128(L) 130(L)  Potassium 3.5 - 5.1 mmol/L 3.7 4.1 4.2  Chloride 98 - 111 mmol/L 104 102 103  CO2 22 - 32 mmol/L 21(L) 19(L) 18(L)  Calcium 8.9 - 10.3 mg/dL 6.37) 6.8(L) 7.1(L)  Total Protein 6.5 - 8.1 g/dL 5.2(L) 5.5(L) 5.6(L)  Total Bilirubin 0.3 - 1.2 mg/dL 8.58) 0.8 0.6  Alkaline Phos 38 - 126 U/L 71 72 79  AST 15 - 41 U/L 121(H) 246(H) 395(H)  ALT 0 - 44 U/L 207(H) 273(H) 332(H)   CBC Latest Ref Rng & Units 01/29/2021 01/28/2021 01/28/2021  WBC 4.0 - 10.5 K/uL 16.4(H) 15.2(H) 8.6  Hemoglobin 12.0 - 15.0 g/dL 10.7(L) 11.5(L) 12.4  Hematocrit 36.0 - 46.0 % 30.9(L) 33.6(L) 36.7  Platelets 150 - 400 K/uL 124(L) 124(L) 100(L)    Assessment/Plan: Stable BP, no meds for now.  HELLP labs improving. No symptoms. OOB encouraged Regular diet Likely discharge to home tomorrow or PPD#3 depending on BP and if she remains stable   LOS: 2 days   03/30/2021, MD 01/29/2021, 10:19 AM

## 2021-01-29 NOTE — Progress Notes (Signed)
MOB was referred for history of depression/anxiety. * Referral screened out by Clinical Social Worker because none of the following criteria appear to apply: ~ History of anxiety/depression during this pregnancy, or of post-partum depression following prior delivery. ~ Diagnosis of anxiety and/or depression within last 3 years. No mental health concerns noted in OB records. OR * MOB's symptoms currently being treated with medication and/or therapy.  Please contact the Clinical Social Worker if needs arise, by [facility] request, or if MOB scores greater than 9/yes to question 10 on Edinburgh Postpartum Depression Screen.   Blaine Hamper, MSW, LCSW Clinical Social Work [phone]

## 2021-01-29 NOTE — Lactation Note (Signed)
This note was copied from a baby's chart. Lactation Consultation Note  Patient Name: Kara Brady [name] Date: 01/29/2021 Reason for consult: Follow-up assessment;Early term 37-38.6wks Age:36 hours  P3, Mother states she is getting ready to shower. Baby sleeping.  She states she has not had a chance to pump yet but plans to. Baby is formula feeding and at this time mother declines needing lactation assistance.    Feeding Mother's Current Feeding Choice: Breast Milk and Formula   Lactation Tools Discussed/Used Tools: Pump Pumping frequency:  (recommend q 3hours)  Interventions Interventions: Education;DEBP   Consult Status Consult Status: Follow-up Date: 01/30/21 Follow-up type: In-patient    Kara Brady [facility] 01/29/2021, 12:12 PM

## 2021-01-30 LAB — COMPREHENSIVE METABOLIC PANEL
ALT: 145 U/L — ABNORMAL HIGH (ref 0–44)
AST: 56 U/L — ABNORMAL HIGH (ref 15–41)
Albumin: 2.3 g/dL — ABNORMAL LOW (ref 3.5–5.0)
Alkaline Phosphatase: 57 U/L (ref 38–126)
Anion gap: 7 (ref 5–15)
BUN: 10 mg/dL (ref 6–20)
CO2: 23 mmol/L (ref 22–32)
Calcium: 8.1 mg/dL — ABNORMAL LOW (ref 8.9–10.3)
Chloride: 106 mmol/L (ref 98–111)
Creatinine, Ser: 0.67 mg/dL (ref 0.44–1.00)
GFR, Estimated: >60 mL/min (ref >60)
Glucose, Bld: 89 mg/dL (ref 70–99)
Potassium: 4.2 mmol/L (ref 3.5–5.1)
Sodium: 136 mmol/L (ref 135–145)
Total Bilirubin: 0.5 mg/dL (ref 0.3–1.2)
Total Protein: 5.2 g/dL — ABNORMAL LOW (ref 6.5–8.1)

## 2021-01-30 LAB — CBC
HCT: 31.9 % — ABNORMAL LOW (ref 36.0–46.0)
Hemoglobin: 10.6 g/dL — ABNORMAL LOW (ref 12.0–15.0)
MCH: 30.9 pg (ref 26.0–34.0)
MCHC: 33.2 g/dL (ref 30.0–36.0)
MCV: 93 fL (ref 80.0–100.0)
Platelets: 126 10*3/uL — ABNORMAL LOW (ref 150–400)
RBC: 3.43 MIL/uL — ABNORMAL LOW (ref 3.87–5.11)
RDW: 15.6 % — ABNORMAL HIGH (ref 11.5–15.5)
WBC: 13.4 10*3/uL — ABNORMAL HIGH (ref 4.0–10.5)
nRBC: 0.2 % (ref 0.0–0.2)

## 2021-01-30 MED ORDER — NIFEDIPINE ER OSMOTIC RELEASE 30 MG PO TB24
30.0000 mg | ORAL_TABLET | Freq: Every day | ORAL | Status: DC
  Administered 2021-01-30: 30 mg via ORAL
  Filled 2021-01-30 (×2): qty 1

## 2021-01-30 MED ORDER — CYCLOBENZAPRINE HCL 10 MG PO TABS
10.0000 mg | ORAL_TABLET | Freq: Three times a day (TID) | ORAL | Status: DC | PRN
  Administered 2021-01-30: 10 mg via ORAL
  Filled 2021-01-30: qty 1

## 2021-01-30 MED ORDER — HYDROCHLOROTHIAZIDE 25 MG PO TABS
25.0000 mg | ORAL_TABLET | Freq: Every day | ORAL | Status: DC
  Administered 2021-01-30 – 2021-02-01 (×3): 25 mg via ORAL
  Filled 2021-01-30 (×3): qty 1

## 2021-01-30 NOTE — Lactation Note (Signed)
This note was copied from a baby's chart. Lactation Consultation Note  Patient Name: Kara Brady LFYBO'F Date: 01/30/2021 Reason for consult: Follow-up assessment;Early term 37-38.6wks Age:36 hours  Mom sitting on bed swaddling sleeping baby, dad sitting on couch. States baby is receiving formula only d/t lack of breastmilk and inability to latch to breast. Mom reports exclusively pumped and provided EBM for first 2 kids (now 11yo and 6yo), d/t inability to latch to breast. Mom reports plans to exclusively pump for current child as well but has not done so since 7p last night d/t lack of milk expressing. Mom denies having a DEBP at home, denies having WIC, states will contact insurance provider for DEBP. Mom reports breasts are currently soft, without pain or damage to nipples.  Discussed pumping for stimulation vs milk collection at this time. Reinforced pump frequency and duration. Discussed DEBP rental options and use of hand pump in the interim. Discussed engorgement and how to manage, and available LC services upon discharge. Parents voiced understanding and with no further concerns. BGilliam, RN, IBCLC  Feeding Mother's Current Feeding Choice: Breast Milk and Formula Nipple Type: Nfant Slow Flow (purple)    Interventions Interventions: Education  Discharge Discharge Education: Engorgement and breast care WIC Program: No  Consult Status Consult Status: Complete Date: 01/30/21    Kara Brady 01/30/2021, 12:00 PM

## 2021-01-30 NOTE — Progress Notes (Signed)
Post Partum Day 2 Subjective: no complaints, up ad lib, voiding, tolerating PO, and + flatus  Objective: Blood pressure (!) 143/90, pulse 70, temperature 97.9 F (36.6 C), temperature source Oral, resp. rate 16, height 5\' 2"  (1.575 m), weight 95.7 kg, last menstrual period 05/13/2020, SpO2 98 %, unknown if currently breastfeeding.  Physical Exam:  General: alert, cooperative, and no distress Lochia: appropriate Uterine Fundus: firm Incision:  DVT Evaluation: No evidence of DVT seen on physical exam.  Recent Labs    01/28/21 1602 01/29/21 0537  HGB 11.5* 10.7*  HCT 33.6* 30.9*    Assessment/Plan: Plan for discharge tomorrow Start procardia xl 30/HCTZ 25   LOS: 3 days   03/31/21 01/30/2021, 7:50 AM

## 2021-01-31 LAB — BPAM RBC
Blood Product Expiration Date: 202209142359
Blood Product Expiration Date: 202209142359
ISSUE DATE / TIME: 202208101908
Unit Type and Rh: 5100
Unit Type and Rh: 5100

## 2021-01-31 LAB — TYPE AND SCREEN
ABO/RH(D): O POS
Antibody Screen: NEGATIVE
Unit division: 0
Unit division: 0

## 2021-01-31 MED ORDER — NIFEDIPINE ER OSMOTIC RELEASE 60 MG PO TB24
60.0000 mg | ORAL_TABLET | Freq: Every day | ORAL | Status: DC
  Administered 2021-01-31 – 2021-02-01 (×2): 60 mg via ORAL
  Filled 2021-01-31 (×2): qty 1

## 2021-01-31 NOTE — Progress Notes (Signed)
Post Partum Day 3 Subjective: no complaints, up ad lib, voiding, tolerating PO, and shoulder pain is improved  Objective: Blood pressure (!) 156/84, pulse 69, temperature 98 F (36.7 C), temperature source Oral, resp. rate 18, height 5\' 2"  (1.575 m), weight 95.7 kg, last menstrual period 05/13/2020, SpO2 98 %, unknown if currently breastfeeding.  Physical Exam:  General: alert, cooperative, and no distress Lochia: appropriate Uterine Fundus: firm Incision:  DVT Evaluation: No evidence of DVT seen on physical exam.  Recent Labs    01/29/21 0537 01/30/21 0716  HGB 10.7* 10.6*  HCT 30.9* 31.9*    Assessment/Plan: BP still not ideal range, increase procardia xl to 60 mg XL, cont HCTZ 25 mg daily Anticipate discharge tomorrow  LOS: 4 days   02/01/21 01/31/2021, 7:39 AM

## 2021-01-31 NOTE — Progress Notes (Signed)
Spoke with Dr. Shawnie Pons regarding patient complaint of left sided chest pain that started around 1600 per patient. Patient describes this pain as fluttery, strong, palpitation-like that comes and goes. BP was 140/93 and other vital signs stable. Patient also has a dull headache. MD aware of everything stated above. No new orders at this time. Will continue to monitor.

## 2021-01-31 NOTE — Progress Notes (Signed)
Spoke with Dr. Despina Hidden regarding patients ongoing complaint of left sided chest pain/tightness. MD aware of most recent vital signs. No new orders at this time. Will continue to monitor.

## 2021-02-01 ENCOUNTER — Other Ambulatory Visit: Payer: 59

## 2021-02-01 ENCOUNTER — Telehealth: Payer: Self-pay | Admitting: *Deleted

## 2021-02-01 ENCOUNTER — Other Ambulatory Visit (HOSPITAL_COMMUNITY): Payer: Self-pay

## 2021-02-01 MED ORDER — NIFEDIPINE ER 60 MG PO TB24
60.0000 mg | ORAL_TABLET | Freq: Every day | ORAL | 1 refills | Status: DC
  Filled 2021-02-01: qty 20, 20d supply, fill #0

## 2021-02-01 MED ORDER — OXYCODONE HCL 10 MG PO TABS
10.0000 mg | ORAL_TABLET | ORAL | 0 refills | Status: DC | PRN
  Filled 2021-02-01: qty 30, 5d supply, fill #0

## 2021-02-01 MED ORDER — HYDROCHLOROTHIAZIDE 25 MG PO TABS
25.0000 mg | ORAL_TABLET | Freq: Every day | ORAL | 1 refills | Status: DC
  Filled 2021-02-01: qty 30, 30d supply, fill #0

## 2021-02-01 MED ORDER — IBUPROFEN 800 MG PO TABS
800.0000 mg | ORAL_TABLET | Freq: Three times a day (TID) | ORAL | 0 refills | Status: DC | PRN
  Filled 2021-02-01: qty 30, 10d supply, fill #0

## 2021-02-01 MED ORDER — MEASLES, MUMPS & RUBELLA VAC IJ SOLR: 0.5000 mL | Freq: Once | INTRAMUSCULAR | Status: DC

## 2021-02-01 NOTE — Discharge Summary (Signed)
See other discharge summary

## 2021-02-01 NOTE — Telephone Encounter (Signed)
Left patient an urgent message to call and schedule 1 week BP and 4 week Postpartum appointment with a MD only per Sharen Counter.

## 2021-02-04 ENCOUNTER — Encounter: Payer: Self-pay | Admitting: Obstetrics and Gynecology

## 2021-02-04 ENCOUNTER — Other Ambulatory Visit: Payer: Self-pay

## 2021-02-04 ENCOUNTER — Ambulatory Visit (INDEPENDENT_AMBULATORY_CARE_PROVIDER_SITE_OTHER): Payer: 59 | Admitting: Obstetrics and Gynecology

## 2021-02-04 VITALS — BP 121/87 | HR 91 | Ht 62.0 in | Wt 187.0 lb

## 2021-02-04 DIAGNOSIS — Z0130 Encounter for examination of blood pressure without abnormal findings: Secondary | ICD-10-CM

## 2021-02-04 NOTE — Progress Notes (Signed)
35 yo 7 days postpartum from a vaginal delivery at 37 weeks secondary to [facility] with superimposed pre-eclampsia here for blood pressure check. Patient was discharge on HCTZ and nifedipine. She reports doing well at home ans is receiving ample assistance. Patient denies any concerns with postpartum depression. She is both breast and bottle feeding  Past Medical History:  Diagnosis Date   Headache    History of migraine headaches    Hypertension    Pregnancy induced hypertension    Past Surgical History:  Procedure Laterality Date   BREAST BIOPSY     Family History  Problem Relation Age of Onset   Hypertension Mother    Diabetes Paternal Grandfather    Social History   Tobacco Use   Smoking status: Never   Smokeless tobacco: Never  Vaping Use   Vaping Use: Never used  Substance Use Topics   Alcohol use: No   Drug use: No   ROS See pertinent in HPI. All other systems reviewed and non contributory Blood pressure 121/87, pulse 91, height 5\' 2"  (1.575 m), weight 187 lb (84.8 kg), last menstrual period 05/13/2020, currently breastfeeding.  GENERAL: Well-developed, well-nourished female in no acute distress.  NEURO: Alert and oriented x 3  A/P 35 yo here for blood pressure check 1 week postpartum - BP well controlled - Continue current medication regimen - Follow up as scheduled for postpartum visit in September

## 2021-02-04 NOTE — Progress Notes (Signed)
Pt is here today for 1 week BP check after SVD.

## 2021-02-09 ENCOUNTER — Telehealth (HOSPITAL_COMMUNITY): Payer: Self-pay

## 2021-02-09 NOTE — Telephone Encounter (Signed)
No answer. Left message to return nurse call.   Marcelino Duster [facility] 02/09/2021,1159

## 2021-02-23 ENCOUNTER — Other Ambulatory Visit (HOSPITAL_COMMUNITY): Payer: Self-pay

## 2021-02-24 NOTE — Progress Notes (Signed)
Post Partum Exam  Kara Brady is a 35 y.o. [phone] female who presents for a postpartum visit. She is 4 weeks postpartum following a spontaneous vaginal delivery. I have fully reviewed the prenatal and intrapartum course. The delivery was at 37.1 gestational weeks.  Anesthesia: none. Postpartum course has been unremarkable. Baby's course has been unremarkable. Baby is feeding by bottle - [place] . Bleeding staining only. Bowel function is remarkable with some constipation. Bladder function is normal. Patient is not sexually active. Contraception method is oral progesterone-only contraceptive. Postpartum depression screening:neg     Review of Systems Pertinent items noted in HPI and remainder of comprehensive ROS otherwise negative.    Objective:    BP 116/78 mmHg  Pulse 78  Resp 16  Ht 5\' 5"  (1.651 m)  Wt 211 lb (95.709 kg)  BMI 35.11 kg/m2  Breastfeeding? Yes  General:  alert, cooperative, and no distress   Breasts:  inspection negative, no nipple discharge or bleeding, no masses or nodularity palpable  Lungs: clear to auscultation bilaterally  Heart:  regular rate and rhythm  Abdomen: soft, non-tender; bowel sounds normal; no masses,  no organomegaly, incision healed without complications   Vulva:  normal  Vagina: normal vagina, no discharge, exudate, lesion, or erythema  Cervix:  multiparous appearance  Corpus: normal size, contour, position, consistency, mobility, non-tender  Adnexa:  normal adnexa and no mass, fullness, tenderness  Rectal Exam: Not performed.        Assessment:    Normal postpartum exam without concerns for postpartum depression. Pap smear done at today's visit.  Plan:   1. Contraception: oral progesterone-only contraceptive- Rx Lyza provided 2. Patient to follow up with PCP regarding CHTN- Rx provided 3. Follow up in:  12  months or as needed.

## 2021-02-25 ENCOUNTER — Other Ambulatory Visit: Payer: Self-pay

## 2021-02-25 ENCOUNTER — Other Ambulatory Visit (HOSPITAL_COMMUNITY)
Admission: RE | Admit: 2021-02-25 | Discharge: 2021-02-25 | Disposition: A | Discharge status: Home / Self Care | Payer: 59 | Source: Ambulatory Visit | Attending: Obstetrics and Gynecology | Admitting: Obstetrics and Gynecology

## 2021-02-25 ENCOUNTER — Encounter: Payer: Self-pay | Admitting: Obstetrics and Gynecology

## 2021-02-25 ENCOUNTER — Ambulatory Visit (INDEPENDENT_AMBULATORY_CARE_PROVIDER_SITE_OTHER): Payer: 59 | Admitting: Obstetrics and Gynecology

## 2021-02-25 MED ORDER — LISINOPRIL-HYDROCHLOROTHIAZIDE 10-12.5 MG PO TABS: 1.0000 | ORAL_TABLET | Freq: Every day | ORAL | 11 refills | Status: DC

## 2021-02-25 MED ORDER — NORETHINDRONE 0.35 MG PO TABS: 1.0000 | ORAL_TABLET | Freq: Every day | ORAL | 11 refills | Status: DC

## 2021-03-03 LAB — CYTOLOGY - PAP
Comment: NEGATIVE
Diagnosis: NEGATIVE
High risk HPV: NEGATIVE

## 2021-12-27 ENCOUNTER — Encounter: Payer: Self-pay | Admitting: *Deleted

## 2021-12-27 DIAGNOSIS — O0990 Supervision of high risk pregnancy, unspecified, unspecified trimester: Secondary | ICD-10-CM | POA: Insufficient documentation

## 2021-12-27 DIAGNOSIS — Z3490 Encounter for supervision of normal pregnancy, unspecified, unspecified trimester: Secondary | ICD-10-CM | POA: Insufficient documentation

## 2021-12-27 DIAGNOSIS — O09899 Supervision of other high risk pregnancies, unspecified trimester: Secondary | ICD-10-CM | POA: Insufficient documentation

## 2021-12-28 ENCOUNTER — Ambulatory Visit (INDEPENDENT_AMBULATORY_CARE_PROVIDER_SITE_OTHER): Payer: BC Managed Care – PPO

## 2021-12-28 ENCOUNTER — Other Ambulatory Visit (HOSPITAL_COMMUNITY)
Admission: RE | Admit: 2021-12-28 | Discharge: 2021-12-28 | Disposition: A | Discharge status: Home / Self Care | Payer: BC Managed Care – PPO | Source: Ambulatory Visit

## 2021-12-28 VITALS — BP 130/83 | HR 71 | Wt 182.0 lb

## 2021-12-28 DIAGNOSIS — O0990 Supervision of high risk pregnancy, unspecified, unspecified trimester: Secondary | ICD-10-CM | POA: Diagnosis not present

## 2021-12-28 DIAGNOSIS — O09291 Supervision of pregnancy with other poor reproductive or obstetric history, first trimester: Secondary | ICD-10-CM

## 2021-12-28 DIAGNOSIS — Z1629 Resistance to other single specified antibiotic: Secondary | ICD-10-CM | POA: Insufficient documentation

## 2021-12-28 DIAGNOSIS — O10911 Unspecified pre-existing hypertension complicating pregnancy, first trimester: Secondary | ICD-10-CM

## 2021-12-28 DIAGNOSIS — B9689 Other specified bacterial agents as the cause of diseases classified elsewhere: Secondary | ICD-10-CM | POA: Insufficient documentation

## 2021-12-28 DIAGNOSIS — B952 Enterococcus as the cause of diseases classified elsewhere: Secondary | ICD-10-CM | POA: Diagnosis not present

## 2021-12-28 DIAGNOSIS — Z3A10 10 weeks gestation of pregnancy: Secondary | ICD-10-CM

## 2021-12-28 DIAGNOSIS — O0991 Supervision of high risk pregnancy, unspecified, first trimester: Secondary | ICD-10-CM | POA: Diagnosis not present

## 2021-12-28 DIAGNOSIS — Z8751 Personal history of pre-term labor: Secondary | ICD-10-CM | POA: Diagnosis not present

## 2021-12-28 DIAGNOSIS — O09299 Supervision of pregnancy with other poor reproductive or obstetric history, unspecified trimester: Secondary | ICD-10-CM

## 2021-12-28 DIAGNOSIS — O09529 Supervision of elderly multigravida, unspecified trimester: Secondary | ICD-10-CM

## 2021-12-28 DIAGNOSIS — O09521 Supervision of elderly multigravida, first trimester: Secondary | ICD-10-CM

## 2021-12-28 DIAGNOSIS — O10919 Unspecified pre-existing hypertension complicating pregnancy, unspecified trimester: Secondary | ICD-10-CM

## 2021-12-28 MED ORDER — ASPIRIN 81 MG PO TBEC: 81.0000 mg | DELAYED_RELEASE_TABLET | Freq: Every day | ORAL | 12 refills | Status: DC

## 2021-12-28 NOTE — Progress Notes (Signed)
Bedside U/S shows active IUP with FHT of 175BPM and CRL 36.94mm  GA [redacted]w[redacted]d.  Consistent with LMP

## 2021-12-28 NOTE — Progress Notes (Signed)
Subjective:   Kara Brady is a 36 y.o. [phone] at [redacted]w[redacted]d by LMP being seen today for her first obstetrical visit.  Her obstetrical history is significant for advanced maternal age and has Bloodgood disease; Clinical depression; Anxiety; Migraine without aura; Hair loss; Chronic hypertension affecting pregnancy; Hx of preeclampsia, prior pregnancy, currently pregnant; History of preterm delivery; Rubella non-immune status, antepartum; History of abnormal cervical Pap smear; Excess weight gain in pregnancy; Chronic hypertension with superimposed preeclampsia; and Supervision of high risk pregnancy, antepartum on their problem list.. Patient does not intend to breast feed. Pregnancy history fully reviewed.  Patient reports no complaints.  HISTORY: OB History  Gravida Para Term Preterm AB Living  5 3 2 1 1 3   SAB IAB Ectopic Multiple Live Births  1 0 0 0 3    # Outcome Date GA Lbr Len/2nd Weight Sex Delivery Anes PTL Lv  5 Current           4 Term 01/28/21 [redacted]w[redacted]d 00:55 / 00:01 6 lb 4.7 oz (2.855 kg) F Vag-Spont Local  LIV     Birth Comments: wnl     Name: Brady,GIRL Kara     Apgar1: 8  Apgar5: 9  3 Preterm 06/04/14 108w2d 02:10 / 00:01 4 lb 11.5 oz (2.14 kg) M Vag-Spont None  LIV     Apgar1: 9  Apgar5: 9  2 Term 01/01/10 [redacted]w[redacted]d  6 lb 13 oz (3.09 kg) M Vag-Spont   LIV  1 SAB            Past Medical History:  Diagnosis Date   Headache    History of migraine headaches    Hypertension    Pregnancy induced hypertension    Past Surgical History:  Procedure Laterality Date   BREAST BIOPSY     Family History  Problem Relation Age of Onset   Hypertension Mother    Diabetes Paternal Grandfather    Social History   Tobacco Use   Smoking status: Never   Smokeless tobacco: Never  Vaping Use   Vaping Use: Never used  Substance Use Topics   Alcohol use: No   Drug use: No   No Known Allergies Current Outpatient Medications on File Prior to Visit  Medication Sig Dispense Refill    labetalol (NORMODYNE) 100 MG tablet Take 100 mg by mouth 2 (two) times daily.     Prenatal Vit-Fe Fumarate-FA (PRENATAL PO) Take 1 tablet by mouth daily.     No current facility-administered medications on file prior to visit.   Indications for ASA therapy (per uptodate) One of the following: Previous pregnancy with preeclampsia, especially early onset and with an adverse outcome Yes Multifetal gestation No Chronic hypertension Yes Type 1 or 2 diabetes mellitus No Chronic kidney disease No Autoimmune disease (antiphospholipid syndrome, systemic lupus erythematosus) No  Two or more of the following: Nulliparity No Obesity (body mass index >30 kg/m2) Yes Family history of preeclampsia in mother or sister No Age ?35 years Yes Sociodemographic characteristics (African American race, low socioeconomic level) No Personal risk factors (eg, previous pregnancy with low birth weight or small for gestational age infant, previous adverse pregnancy outcome [eg, stillbirth], interval >10 years between pregnancies) No  Indications for early 1 hour GTT (per uptodate)  BMI >25 (>23 in Asian women) AND one of the following  Gestational diabetes mellitus in a previous pregnancy No Glycated hemoglobin ?5.7 percent (39 mmol/mol), impaired glucose tolerance, or impaired fasting glucose on previous testing No First-degree  relative with diabetes No High-risk race/ethnicity (eg, African American, Latino, Native American, [place] American, Pacific Islander) Yes History of cardiovascular disease No Hypertension or on therapy for hypertension Yes High-density lipoprotein cholesterol level <35 mg/dL (5.18 mmol/L) and/or a triglyceride level >250 mg/dL (8.41 mmol/L) No Polycystic ovary syndrome No Physical inactivity No Other clinical condition associated with insulin resistance (eg, severe obesity, acanthosis nigricans) No Previous birth of an infant weighing ?4000 g No Previous stillbirth of unknown cause  No Exam   Vitals:   12/28/21 0817  BP: 130/83  Pulse: 71  Weight: 182 lb (82.6 kg)   Fetal Heart Rate (bpm): 175  Uterus:     Pelvic Exam: Perineum: deferred   Vulva: deferred   Vagina:  deferred   Cervix: deferred   Adnexa: deferred   Bony Pelvis: deferred  System: General: well-developed, well-nourished female in no acute distress   Breast:  normal appearance, no masses or tenderness   Skin: normal coloration and turgor, no rashes   Neurologic: oriented, normal, negative, normal mood   Extremities: normal strength, tone, and muscle mass, ROM of all joints is normal   HEENT PERRLA, extraocular movement intact and sclera clear, anicteric   Mouth/Teeth mucous membranes moist, pharynx normal without lesions and dental hygiene good   Neck supple and no masses   Cardiovascular: regular rate and rhythm   Respiratory:  no respiratory distress, normal breath sounds   Abdomen: soft, non-tender; bowel sounds normal; no masses,  no organomegaly     Assessment:   Pregnancy: [id number] Patient Active Problem List   Diagnosis Date Noted   Supervision of high risk pregnancy, antepartum 12/27/2021   Chronic hypertension with superimposed preeclampsia 01/22/2021   Excess weight gain in pregnancy 12/11/2020   History of abnormal cervical Pap smear 10/22/2020   Rubella non-immune status, antepartum 09/07/2020   Chronic hypertension affecting pregnancy 08/27/2020   Hx of preeclampsia, prior pregnancy, currently pregnant 08/27/2020   History of preterm delivery 08/27/2020   Hair loss 10/12/2016   Migraine without aura 03/25/2014   Clinical depression 11/14/2012   Anxiety 10/17/2012   Bloodgood disease 12/23/2011     Plan:  1. Supervision of high risk pregnancy, antepartum - New OB. Doing well, no concerns - Anticipatory guidance for upcoming appointments provided  - Obstetric panel - HIV antibody (with reflex) - CBC - Culture, OB Urine - GC/Chlamydia probe amp ([place])not at  [facility] - Babyscripts Schedule Optimization - [place] bedside; Future - Panorama Prenatal Test Full Panel - Comprehensive metabolic panel - Protein / creatinine ratio, urine - HgB A1c  2. Chronic hypertension affecting pregnancy - Normotensive today - On labetalol 100mg  BID - CMP and UPCR with labs - bASA starting at 12 weeks  - aspirin EC 81 MG tablet; Take 1 tablet (81 mg total) by mouth daily. Swallow whole. Start taking at [redacted] weeks gestation.  Dispense: 30 tablet; Refill: 12  3. History of pre-eclampsia in prior pregnancy, currently pregnant   4. History of preterm delivery - Delivered at 34 weeks d/t SIPE  5. Antepartum multigravida of advanced maternal age   Initial labs drawn. Continue prenatal vitamins. Discussed and offered genetic screening options, including Quad screen/AFP, NIPS testing, and option to decline testing. Benefits/risks/alternatives reviewed. Pt aware that anatomy is form of genetic screening with lower accuracy in detecting trisomies than blood work.  Pt chooses/declines genetic screening today. NIPS: requested. Ultrasound discussed; fetal anatomic survey: requested. Problem list reviewed and updated. The nature of [place] [facility] Women's  Trainer with multiple MDs and other Advanced Practice Providers was explained to patient; also emphasized that residents, students are part of our team. Routine obstetric precautions reviewed. Return in about 4 weeks (around 01/25/2022).   Renee Harder, CNM 12/28/21 8:45 AM

## 2021-12-29 LAB — GC/CHLAMYDIA PROBE AMP (~~LOC~~) NOT AT ARMC
Chlamydia: NEGATIVE
Comment: NEGATIVE
Comment: NORMAL
Neisseria Gonorrhea: NEGATIVE

## 2021-12-30 LAB — OBSTETRIC PANEL
Absolute Monocytes: 490 cells/uL (ref 200–950)
Antibody Screen: NOT DETECTED
Basophils Absolute: 40 cells/uL (ref 0–200)
Basophils Relative: 0.5 %
Eosinophils Absolute: 40 cells/uL (ref 15–500)
Eosinophils Relative: 0.5 %
HCT: 38.7 % (ref 35.0–45.0)
Hemoglobin: 12.6 g/dL (ref 11.7–15.5)
Hepatitis B Surface Ag: NONREACTIVE
Lymphs Abs: 2394 cells/uL (ref 850–3900)
MCH: 30.6 pg (ref 27.0–33.0)
MCHC: 32.6 g/dL (ref 32.0–36.0)
MCV: 93.9 fL (ref 80.0–100.0)
MPV: 11.2 fL (ref 7.5–12.5)
Monocytes Relative: 6.2 %
Neutro Abs: 4938 cells/uL (ref 1500–7800)
Neutrophils Relative %: 62.5 %
Platelets: 288 10*3/uL (ref 140–400)
RBC: 4.12 10*6/uL (ref 3.80–5.10)
RDW: 13.7 % (ref 11.0–15.0)
RPR Ser Ql: NONREACTIVE
Rubella: 1.26 Index
Total Lymphocyte: 30.3 %
WBC: 7.9 10*3/uL (ref 3.8–10.8)

## 2021-12-30 LAB — COMPREHENSIVE METABOLIC PANEL
AG Ratio: 1.5 (calc) (ref 1.0–2.5)
ALT: 8 U/L (ref 6–29)
AST: 11 U/L (ref 10–30)
Albumin: 4 g/dL (ref 3.6–5.1)
Alkaline phosphatase (APISO): 45 U/L (ref 31–125)
BUN/Creatinine Ratio: 12 (calc) (ref 6–22)
BUN: 6 mg/dL — ABNORMAL LOW (ref 7–25)
CO2: 23 mmol/L (ref 20–32)
Calcium: 8.9 mg/dL (ref 8.6–10.2)
Chloride: 104 mmol/L (ref 98–110)
Creat: 0.5 mg/dL (ref 0.50–0.97)
Globulin: 2.7 g/dL (calc) (ref 1.9–3.7)
Glucose, Bld: 80 mg/dL (ref 65–99)
Potassium: 3.9 mmol/L (ref 3.5–5.3)
Sodium: 138 mmol/L (ref 135–146)
Total Bilirubin: 0.3 mg/dL (ref 0.2–1.2)
Total Protein: 6.7 g/dL (ref 6.1–8.1)

## 2021-12-30 LAB — HIV ANTIBODY (ROUTINE TESTING W REFLEX): HIV 1&2 Ab, 4th Generation: NONREACTIVE

## 2021-12-30 LAB — PROTEIN / CREATININE RATIO, URINE
Creatinine, Urine: 108 mg/dL (ref 20–275)
Protein/Creat Ratio: 83 mg/g creat (ref 24–184)
Protein/Creatinine Ratio: 0.083 mg/mg creat (ref 0.024–0.184)
Total Protein, Urine: 9 mg/dL (ref 5–24)

## 2021-12-30 LAB — HEMOGLOBIN A1C
Hgb A1c MFr Bld: 5.1 % of total Hgb (ref <5.7)
Mean Plasma Glucose: 100 mg/dL
eAG (mmol/L): 5.5 mmol/L

## 2021-12-31 LAB — URINE CULTURE, OB REFLEX

## 2021-12-31 LAB — CULTURE, OB URINE

## 2022-01-03 ENCOUNTER — Encounter: Payer: Self-pay | Admitting: Certified Nurse Midwife

## 2022-01-04 ENCOUNTER — Other Ambulatory Visit: Payer: Self-pay

## 2022-01-04 DIAGNOSIS — O2341 Unspecified infection of urinary tract in pregnancy, first trimester: Secondary | ICD-10-CM

## 2022-01-04 MED ORDER — CEFADROXIL 500 MG PO CAPS: 500.0000 mg | ORAL_CAPSULE | Freq: Two times a day (BID) | ORAL | 0 refills | Status: AC

## 2022-01-14 DIAGNOSIS — O0990 Supervision of high risk pregnancy, unspecified, unspecified trimester: Secondary | ICD-10-CM | POA: Diagnosis not present

## 2022-01-22 LAB — PANORAMA PRENATAL TEST FULL PANEL:PANORAMA TEST PLUS 5 ADDITIONAL MICRODELETIONS: FETAL FRACTION: 5

## 2022-01-28 ENCOUNTER — Ambulatory Visit (INDEPENDENT_AMBULATORY_CARE_PROVIDER_SITE_OTHER): Payer: BC Managed Care – PPO | Admitting: Certified Nurse Midwife

## 2022-01-28 VITALS — BP 117/73 | HR 69 | Wt 185.0 lb

## 2022-01-28 DIAGNOSIS — Z3A15 15 weeks gestation of pregnancy: Secondary | ICD-10-CM

## 2022-01-28 DIAGNOSIS — O09292 Supervision of pregnancy with other poor reproductive or obstetric history, second trimester: Secondary | ICD-10-CM

## 2022-01-28 DIAGNOSIS — O2342 Unspecified infection of urinary tract in pregnancy, second trimester: Secondary | ICD-10-CM

## 2022-01-28 DIAGNOSIS — O0992 Supervision of high risk pregnancy, unspecified, second trimester: Secondary | ICD-10-CM

## 2022-01-28 DIAGNOSIS — O0990 Supervision of high risk pregnancy, unspecified, unspecified trimester: Secondary | ICD-10-CM

## 2022-01-28 DIAGNOSIS — O10919 Unspecified pre-existing hypertension complicating pregnancy, unspecified trimester: Secondary | ICD-10-CM

## 2022-01-28 DIAGNOSIS — O2341 Unspecified infection of urinary tract in pregnancy, first trimester: Secondary | ICD-10-CM | POA: Diagnosis not present

## 2022-01-28 DIAGNOSIS — O10912 Unspecified pre-existing hypertension complicating pregnancy, second trimester: Secondary | ICD-10-CM

## 2022-01-28 DIAGNOSIS — O09299 Supervision of pregnancy with other poor reproductive or obstetric history, unspecified trimester: Secondary | ICD-10-CM

## 2022-01-28 NOTE — Patient Instructions (Signed)

## 2022-01-28 NOTE — Progress Notes (Signed)
Subjective:  Kara Brady is a 36 y.o. [phone] at [redacted]w[redacted]d being seen today for ongoing prenatal care.  She is currently monitored for the following issues for this high-risk pregnancy and has Bloodgood disease; Clinical depression; Anxiety; Migraine without aura; Hair loss; Chronic hypertension affecting pregnancy; Hx of preeclampsia, prior pregnancy, currently pregnant; History of preterm delivery; Rubella non-immune status, antepartum; History of abnormal cervical Pap smear; Chronic hypertension with superimposed preeclampsia; and Short interval between pregnancies affecting pregnancy, antepartum on their problem list.  Patient reports  constipation .  Contractions: Not present. Vag. Bleeding: None.  Movement: Absent. Denies leaking of fluid.   The following portions of the patient's history were reviewed and updated as appropriate: allergies, current medications, past family history, past medical history, past social history, past surgical history and problem list. Problem list updated.  Objective:   Vitals:   01/28/22 0808  BP: 117/73  Pulse: 69  Weight: 185 lb (83.9 kg)    Fetal Status: Fetal Heart Rate (bpm): 148   Movement: Absent     General:  Alert, oriented and cooperative. Patient is in no acute distress.  Skin: Skin is warm and dry. No rash noted.   Cardiovascular: Normal heart rate noted  Respiratory: Normal respiratory effort, no problems with respiration noted  Abdomen: Soft, gravid, appropriate for gestational age. Pain/Pressure: Absent     Pelvic: Vag. Bleeding: None Vag D/C Character: Thin   Cervical exam deferred        Extremities: Normal range of motion.  Edema: None  Mental Status: Normal mood and affect. Normal behavior. Normal judgment and thought content.   Urinalysis:      Assessment and Plan:  Pregnancy: [id number] at [redacted]w[redacted]d  1. Urinary tract infection in mother during first trimester of pregnancy - Culture, OB Urine - treated 01/04/22  2. Supervision of high  risk pregnancy, antepartum - [place] MFM OB DETAIL +14 WK; Future - AFP in 1-2 weeks  3. Chronic hypertension affecting pregnancy -bASA  4. Hx of preeclampsia, prior pregnancy, currently pregnant  5. [redacted] weeks gestation of pregnancy   Preterm labor symptoms and general obstetric precautions including but not limited to vaginal bleeding, contractions, leaking of fluid and fetal movement were reviewed in detail with the patient. Please refer to After Visit Summary for other counseling recommendations.  Return in about 5 weeks (around 03/04/2022).   Donette Larry, CNM

## 2022-01-31 LAB — URINE CULTURE, OB REFLEX

## 2022-01-31 LAB — CULTURE, OB URINE

## 2022-02-10 ENCOUNTER — Other Ambulatory Visit: Payer: Self-pay | Admitting: Obstetrics and Gynecology

## 2022-02-11 ENCOUNTER — Other Ambulatory Visit (INDEPENDENT_AMBULATORY_CARE_PROVIDER_SITE_OTHER): Payer: BC Managed Care – PPO

## 2022-02-11 DIAGNOSIS — O0990 Supervision of high risk pregnancy, unspecified, unspecified trimester: Secondary | ICD-10-CM

## 2022-02-11 DIAGNOSIS — O0992 Supervision of high risk pregnancy, unspecified, second trimester: Secondary | ICD-10-CM

## 2022-02-11 DIAGNOSIS — Z3A17 17 weeks gestation of pregnancy: Secondary | ICD-10-CM

## 2022-02-11 NOTE — Progress Notes (Signed)
Pt here for AFP. Pt sent to lab.

## 2022-02-18 DIAGNOSIS — O0990 Supervision of high risk pregnancy, unspecified, unspecified trimester: Secondary | ICD-10-CM | POA: Diagnosis not present

## 2022-02-23 LAB — ALPHA FETOPROTEIN, MATERNAL
AFP MoM: 1.14
AFP, Serum: 42.7 ng/mL
Calc'd Gestational Age: 18.1 weeks
Maternal Wt: 184 [lb_av]
Risk for ONTD: <1
Twins-AFP: 1

## 2022-02-23 LAB — TIQ-AOE

## 2022-02-28 ENCOUNTER — Ambulatory Visit: Payer: BC Managed Care – PPO | Attending: Certified Nurse Midwife

## 2022-02-28 ENCOUNTER — Ambulatory Visit: Payer: BC Managed Care – PPO | Admitting: *Deleted

## 2022-02-28 VITALS — BP 130/82 | HR 75

## 2022-02-28 DIAGNOSIS — O09899 Supervision of other high risk pregnancies, unspecified trimester: Secondary | ICD-10-CM | POA: Diagnosis not present

## 2022-02-28 DIAGNOSIS — O0990 Supervision of high risk pregnancy, unspecified, unspecified trimester: Secondary | ICD-10-CM | POA: Insufficient documentation

## 2022-03-01 ENCOUNTER — Other Ambulatory Visit: Payer: Self-pay | Admitting: *Deleted

## 2022-03-01 DIAGNOSIS — O10919 Unspecified pre-existing hypertension complicating pregnancy, unspecified trimester: Secondary | ICD-10-CM

## 2022-03-01 DIAGNOSIS — O09522 Supervision of elderly multigravida, second trimester: Secondary | ICD-10-CM

## 2022-03-04 ENCOUNTER — Encounter: Payer: BC Managed Care – PPO | Admitting: Obstetrics and Gynecology

## 2022-03-07 ENCOUNTER — Ambulatory Visit (INDEPENDENT_AMBULATORY_CARE_PROVIDER_SITE_OTHER): Payer: BC Managed Care – PPO | Admitting: Obstetrics & Gynecology

## 2022-03-07 VITALS — BP 131/81 | HR 76 | Wt 189.0 lb

## 2022-03-07 DIAGNOSIS — O10912 Unspecified pre-existing hypertension complicating pregnancy, second trimester: Secondary | ICD-10-CM

## 2022-03-07 DIAGNOSIS — Z3A20 20 weeks gestation of pregnancy: Secondary | ICD-10-CM

## 2022-03-07 DIAGNOSIS — O09892 Supervision of other high risk pregnancies, second trimester: Secondary | ICD-10-CM

## 2022-03-07 DIAGNOSIS — O10919 Unspecified pre-existing hypertension complicating pregnancy, unspecified trimester: Secondary | ICD-10-CM

## 2022-03-07 DIAGNOSIS — O09899 Supervision of other high risk pregnancies, unspecified trimester: Secondary | ICD-10-CM

## 2022-03-07 NOTE — Progress Notes (Signed)
   PRENATAL VISIT NOTE  Subjective:  Kara Brady is a 36 y.o. [phone] at [redacted]w[redacted]d being seen today for ongoing prenatal care.  She is currently monitored for the following issues for this high-risk pregnancy and has Chronic hypertension affecting pregnancy; Hx of preeclampsia, prior pregnancy, currently pregnant; History of preterm delivery; History of abnormal cervical Pap smear; and Short interval between pregnancies affecting pregnancy, antepartum on their problem list.  Patient reports no complaints.  Contractions: Not present. Vag. Bleeding: None.  Movement: Present. Denies leaking of fluid.   The following portions of the patient's history were reviewed and updated as appropriate: allergies, current medications, past family history, past medical history, past social history, past surgical history and problem list.   Objective:   Vitals:   03/07/22 1312  BP: 131/81  Pulse: 76  Weight: 189 lb (85.7 kg)    Fetal Status: Fetal Heart Rate (bpm): 141   Movement: Present     General:  Alert, oriented and cooperative. Patient is in no acute distress.  Skin: Skin is warm and dry. No rash noted.   Cardiovascular: Normal heart rate noted  Respiratory: Normal respiratory effort, no problems with respiration noted  Abdomen: Soft, gravid, appropriate for gestational age.  Pain/Pressure: Absent     Pelvic: Cervical exam deferred        Extremities: Normal range of motion.  Edema: None  Mental Status: Normal mood and affect. Normal behavior. Normal judgment and thought content.   Assessment and Plan:  Pregnancy: [id number] at [redacted]w[redacted]d 1. Short interval between pregnancies affecting pregnancy, antepartum Pt would like BTL  2. Chronic hypertension affecting pregnancy BP stable; states much lower at home.   Term labor symptoms and general obstetric precautions including but not limited to vaginal bleeding, contractions, leaking of fluid and fetal movement were reviewed in detail with the  patient. Please refer to After Visit Summary for other counseling recommendations.   3.  Declined flu shot today--might get it later  RTC 4 weeks  Future Appointments  Date Time Provider [place]  03/07/2022  1:30 PM Guss Bunde, MD [facility]  03/29/2022  8:15 AM [facility] NURSE [facility]  03/29/2022  8:30 AM [facility] US2 [facility]    Silas [place], MD

## 2022-03-29 ENCOUNTER — Ambulatory Visit: Payer: BC Managed Care – PPO | Attending: Obstetrics

## 2022-03-29 ENCOUNTER — Other Ambulatory Visit: Payer: Self-pay

## 2022-03-29 ENCOUNTER — Ambulatory Visit: Payer: BC Managed Care – PPO | Admitting: *Deleted

## 2022-03-29 DIAGNOSIS — O10919 Unspecified pre-existing hypertension complicating pregnancy, unspecified trimester: Secondary | ICD-10-CM | POA: Insufficient documentation

## 2022-03-29 DIAGNOSIS — O09292 Supervision of pregnancy with other poor reproductive or obstetric history, second trimester: Secondary | ICD-10-CM

## 2022-03-29 DIAGNOSIS — O09899 Supervision of other high risk pregnancies, unspecified trimester: Secondary | ICD-10-CM | POA: Diagnosis not present

## 2022-03-29 DIAGNOSIS — O09892 Supervision of other high risk pregnancies, second trimester: Secondary | ICD-10-CM

## 2022-03-29 DIAGNOSIS — O09522 Supervision of elderly multigravida, second trimester: Secondary | ICD-10-CM | POA: Insufficient documentation

## 2022-03-29 DIAGNOSIS — Z3A23 23 weeks gestation of pregnancy: Secondary | ICD-10-CM

## 2022-03-29 DIAGNOSIS — O10912 Unspecified pre-existing hypertension complicating pregnancy, second trimester: Secondary | ICD-10-CM

## 2022-03-29 DIAGNOSIS — O09212 Supervision of pregnancy with history of pre-term labor, second trimester: Secondary | ICD-10-CM

## 2022-03-29 DIAGNOSIS — O10012 Pre-existing essential hypertension complicating pregnancy, second trimester: Secondary | ICD-10-CM

## 2022-04-04 ENCOUNTER — Encounter: Payer: Self-pay | Admitting: Obstetrics and Gynecology

## 2022-04-04 ENCOUNTER — Ambulatory Visit (INDEPENDENT_AMBULATORY_CARE_PROVIDER_SITE_OTHER): Payer: BC Managed Care – PPO | Admitting: Obstetrics and Gynecology

## 2022-04-04 VITALS — BP 122/78 | HR 80 | Wt 197.0 lb

## 2022-04-04 DIAGNOSIS — O10919 Unspecified pre-existing hypertension complicating pregnancy, unspecified trimester: Secondary | ICD-10-CM

## 2022-04-04 DIAGNOSIS — Z3A24 24 weeks gestation of pregnancy: Secondary | ICD-10-CM

## 2022-04-04 DIAGNOSIS — O10912 Unspecified pre-existing hypertension complicating pregnancy, second trimester: Secondary | ICD-10-CM

## 2022-04-04 DIAGNOSIS — Z8751 Personal history of pre-term labor: Secondary | ICD-10-CM

## 2022-04-04 DIAGNOSIS — Z3009 Encounter for other general counseling and advice on contraception: Secondary | ICD-10-CM | POA: Insufficient documentation

## 2022-04-04 DIAGNOSIS — O09899 Supervision of other high risk pregnancies, unspecified trimester: Secondary | ICD-10-CM

## 2022-04-04 NOTE — Progress Notes (Signed)
   PRENATAL VISIT NOTE  Subjective:  Kara Brady is a 36 y.o. [phone] at [redacted]w[redacted]d being seen today for ongoing prenatal care.  She is currently monitored for the following issues for this high-risk pregnancy and has Chronic hypertension affecting pregnancy; Hx of preeclampsia, prior pregnancy, currently pregnant; History of preterm delivery; History of abnormal cervical Pap smear; Short interval between pregnancies affecting pregnancy, antepartum; and Unwanted fertility on their problem list.  Patient reports no complaints.  Contractions: Not present. Vag. Bleeding: None.  Movement: Present. Denies leaking of fluid.   The following portions of the patient's history were reviewed and updated as appropriate: allergies, current medications, past family history, past medical history, past social history, past surgical history and problem list.   Objective:   Vitals:   04/04/22 1311  BP: 122/78  Pulse: 80  Weight: 197 lb (89.4 kg)    Fetal Status: Fetal Heart Rate (bpm): 141   Movement: Present     General:  Alert, oriented and cooperative. Patient is in no acute distress.  Skin: Skin is warm and dry. No rash noted.   Cardiovascular: Normal heart rate noted  Respiratory: Normal respiratory effort, no problems with respiration noted  Abdomen: Soft, gravid, appropriate for gestational age.  Pain/Pressure: Absent     Pelvic: Cervical exam deferred        Extremities: Normal range of motion.  Edema: None  Mental Status: Normal mood and affect. Normal behavior. Normal judgment and thought content.   Assessment and Plan:  Pregnancy: [id number] at [redacted]w[redacted]d  1. Chronic hypertension affecting pregnancy Cont baby aspirin Cont labetalol 100 mg BID Growth wnl  2. Short interval between pregnancies affecting pregnancy, antepartum  3. History of preterm delivery 2/2 pre-eclampsia  4. [redacted] weeks gestation of pregnancy   Preterm labor symptoms and general obstetric precautions including but not limited  to vaginal bleeding, contractions, leaking of fluid and fetal movement were reviewed in detail with the patient. Please refer to After Visit Summary for other counseling recommendations.   Return in about 4 weeks (around 05/02/2022) for high OB.  Future Appointments  Date Time Provider [place]  04/26/2022  8:15 AM [facility] NURSE [facility]  04/26/2022  8:30 AM [facility] US3 [facility]  05/24/2022  8:30 AM [facility] NURSE [facility]  05/24/2022  8:45 AM [facility] US4 [facility]    Sloan Leiter, MD

## 2022-04-26 ENCOUNTER — Ambulatory Visit: Payer: BC Managed Care – PPO | Admitting: *Deleted

## 2022-04-26 ENCOUNTER — Ambulatory Visit: Payer: BC Managed Care – PPO | Attending: Maternal & Fetal Medicine

## 2022-04-26 ENCOUNTER — Other Ambulatory Visit: Payer: Self-pay | Admitting: *Deleted

## 2022-04-26 VITALS — BP 114/75 | HR 79

## 2022-04-26 DIAGNOSIS — O09292 Supervision of pregnancy with other poor reproductive or obstetric history, second trimester: Secondary | ICD-10-CM | POA: Diagnosis not present

## 2022-04-26 DIAGNOSIS — O09213 Supervision of pregnancy with history of pre-term labor, third trimester: Secondary | ICD-10-CM

## 2022-04-26 DIAGNOSIS — O10012 Pre-existing essential hypertension complicating pregnancy, second trimester: Secondary | ICD-10-CM

## 2022-04-26 DIAGNOSIS — O10912 Unspecified pre-existing hypertension complicating pregnancy, second trimester: Secondary | ICD-10-CM | POA: Diagnosis not present

## 2022-04-26 DIAGNOSIS — O09899 Supervision of other high risk pregnancies, unspecified trimester: Secondary | ICD-10-CM | POA: Diagnosis not present

## 2022-04-26 DIAGNOSIS — O09522 Supervision of elderly multigravida, second trimester: Secondary | ICD-10-CM | POA: Insufficient documentation

## 2022-04-26 DIAGNOSIS — Z3A27 27 weeks gestation of pregnancy: Secondary | ICD-10-CM

## 2022-04-26 DIAGNOSIS — O10919 Unspecified pre-existing hypertension complicating pregnancy, unspecified trimester: Secondary | ICD-10-CM

## 2022-04-26 DIAGNOSIS — O09293 Supervision of pregnancy with other poor reproductive or obstetric history, third trimester: Secondary | ICD-10-CM

## 2022-04-26 DIAGNOSIS — O09892 Supervision of other high risk pregnancies, second trimester: Secondary | ICD-10-CM | POA: Insufficient documentation

## 2022-04-26 DIAGNOSIS — O99213 Obesity complicating pregnancy, third trimester: Secondary | ICD-10-CM

## 2022-04-27 ENCOUNTER — Other Ambulatory Visit: Payer: Self-pay | Admitting: *Deleted

## 2022-04-27 DIAGNOSIS — O09299 Supervision of pregnancy with other poor reproductive or obstetric history, unspecified trimester: Secondary | ICD-10-CM

## 2022-04-27 DIAGNOSIS — O99212 Obesity complicating pregnancy, second trimester: Secondary | ICD-10-CM

## 2022-04-27 DIAGNOSIS — O10912 Unspecified pre-existing hypertension complicating pregnancy, second trimester: Secondary | ICD-10-CM

## 2022-04-27 DIAGNOSIS — O09892 Supervision of other high risk pregnancies, second trimester: Secondary | ICD-10-CM

## 2022-05-02 ENCOUNTER — Ambulatory Visit (INDEPENDENT_AMBULATORY_CARE_PROVIDER_SITE_OTHER): Payer: BC Managed Care – PPO | Admitting: Obstetrics & Gynecology

## 2022-05-02 VITALS — BP 117/75 | HR 76 | Wt 199.0 lb

## 2022-05-02 DIAGNOSIS — Z3A28 28 weeks gestation of pregnancy: Secondary | ICD-10-CM | POA: Diagnosis not present

## 2022-05-02 DIAGNOSIS — Z23 Encounter for immunization: Secondary | ICD-10-CM | POA: Diagnosis not present

## 2022-05-02 DIAGNOSIS — O10919 Unspecified pre-existing hypertension complicating pregnancy, unspecified trimester: Secondary | ICD-10-CM | POA: Diagnosis not present

## 2022-05-02 DIAGNOSIS — O09899 Supervision of other high risk pregnancies, unspecified trimester: Secondary | ICD-10-CM

## 2022-05-02 DIAGNOSIS — O10913 Unspecified pre-existing hypertension complicating pregnancy, third trimester: Secondary | ICD-10-CM

## 2022-05-02 DIAGNOSIS — O09893 Supervision of other high risk pregnancies, third trimester: Secondary | ICD-10-CM

## 2022-05-02 NOTE — Progress Notes (Signed)
.    PRENATAL VISIT NOTE  Subjective:  Kara Brady is a 36 y.o. [phone] at [redacted]w[redacted]d being seen today for ongoing prenatal care.  She is currently monitored for the following issues for this high-risk pregnancy and has Chronic hypertension affecting pregnancy; Hx of preeclampsia, prior pregnancy, currently pregnant; History of preterm delivery; History of abnormal cervical Pap smear; Short interval between pregnancies affecting pregnancy, antepartum; and Unwanted fertility on their problem list.  Patient reports no complaints.  Contractions: Not present. Vag. Bleeding: None.  Movement: Present. Denies leaking of fluid.   The following portions of the patient's history were reviewed and updated as appropriate: allergies, current medications, past family history, past medical history, past social history, past surgical history and problem list.   Objective:   Vitals:   05/02/22 0845  BP: 117/75  Pulse: 76  Weight: 199 lb (90.3 kg)    Fetal Status: Fetal Heart Rate (bpm): 135   Movement: Present     General:  Alert, oriented and cooperative. Patient is in no acute distress.  Skin: Skin is warm and dry. No rash noted.   Cardiovascular: Normal heart rate noted  Respiratory: Normal respiratory effort, no problems with respiration noted  Abdomen: Soft, gravid, appropriate for gestational age.  Pain/Pressure: Absent     Pelvic: Cervical exam deferred        Extremities: Normal range of motion.  Edema: None  Mental Status: Normal mood and affect. Normal behavior. Normal judgment and thought content.   Assessment and Plan:  Pregnancy: [id number] at [redacted]w[redacted]d 1. Chronic hypertension affecting pregnancy BP nml today; Baby Rx data flowing and nml - 2Hr GTT w/ 1 Hr Carpenter 75 g - CBC - HIV antibody (with reflex) - RPR  2. Short interval between pregnancies affecting pregnancy, antepartum - Tdap vaccine greater than or equal to 7yo IM  Preterm labor symptoms and general obstetric precautions  including but not limited to vaginal bleeding, contractions, leaking of fluid and fetal movement were reviewed in detail with the patient. Please refer to After Visit Summary for other counseling recommendations.   No follow-ups on file.  Future Appointments  Date Time Provider Department Center  05/24/2022  8:30 AM [facility] NURSE [facility]  05/24/2022  8:45 AM [facility] US4 [facility]  05/31/2022  8:15 AM [facility] NURSE [facility]  05/31/2022  8:30 AM [facility] US2 [facility]  06/07/2022  8:15 AM [facility] NURSE [facility]  06/07/2022  8:30 AM [facility] US2 [facility]    Elsie Lincoln, MD

## 2022-05-03 LAB — CBC
HCT: 37.1 % (ref 35.0–45.0)
Hemoglobin: 12.7 g/dL (ref 11.7–15.5)
MCH: 30.7 pg (ref 27.0–33.0)
MCHC: 34.2 g/dL (ref 32.0–36.0)
MCV: 89.6 fL (ref 80.0–100.0)
MPV: 10.6 fL (ref 7.5–12.5)
Platelets: 270 10*3/uL (ref 140–400)
RBC: 4.14 10*6/uL (ref 3.80–5.10)
RDW: 13.2 % (ref 11.0–15.0)
WBC: 8.7 10*3/uL (ref 3.8–10.8)

## 2022-05-03 LAB — 2HR GTT W 1 HR, CARPENTER, 75 G
Glucose, 1 Hr, Gest: 165 mg/dL (ref 65–179)
Glucose, 2 Hr, Gest: 116 mg/dL (ref 65–152)
Glucose, Fasting, Gest: 83 mg/dL (ref 65–91)

## 2022-05-03 LAB — HIV ANTIBODY (ROUTINE TESTING W REFLEX): HIV 1&2 Ab, 4th Generation: NONREACTIVE

## 2022-05-03 LAB — RPR: RPR Ser Ql: NONREACTIVE

## 2022-05-17 ENCOUNTER — Ambulatory Visit (INDEPENDENT_AMBULATORY_CARE_PROVIDER_SITE_OTHER): Payer: BC Managed Care – PPO | Admitting: Obstetrics and Gynecology

## 2022-05-17 VITALS — BP 128/87 | HR 79 | Wt 203.0 lb

## 2022-05-17 DIAGNOSIS — O0993 Supervision of high risk pregnancy, unspecified, third trimester: Secondary | ICD-10-CM

## 2022-05-17 DIAGNOSIS — Z3A30 30 weeks gestation of pregnancy: Secondary | ICD-10-CM

## 2022-05-17 DIAGNOSIS — O0990 Supervision of high risk pregnancy, unspecified, unspecified trimester: Secondary | ICD-10-CM

## 2022-05-17 DIAGNOSIS — Z3400 Encounter for supervision of normal first pregnancy, unspecified trimester: Secondary | ICD-10-CM

## 2022-05-17 DIAGNOSIS — O10913 Unspecified pre-existing hypertension complicating pregnancy, third trimester: Secondary | ICD-10-CM

## 2022-05-17 DIAGNOSIS — O10919 Unspecified pre-existing hypertension complicating pregnancy, unspecified trimester: Secondary | ICD-10-CM

## 2022-05-17 MED ORDER — LABETALOL HCL 100 MG PO TABS: 100.0000 mg | ORAL_TABLET | Freq: Three times a day (TID) | ORAL | 1 refills | Status: DC

## 2022-05-17 MED ORDER — FAMOTIDINE 20 MG PO TABS: 20.0000 mg | ORAL_TABLET | Freq: Two times a day (BID) | ORAL | 1 refills | Status: AC

## 2022-05-17 NOTE — Progress Notes (Signed)
   PRENATAL VISIT NOTE  Subjective:  Kara Brady is a 36 y.o. [phone] at [redacted]w[redacted]d being seen today for ongoing prenatal care.  She is currently monitored for the following issues for this low-risk pregnancy and has Chronic hypertension affecting pregnancy; Hx of preeclampsia, prior pregnancy, currently pregnant; History of preterm delivery; History of abnormal cervical Pap smear; Supervision of normal pregnancy; and Unwanted fertility on their problem list.  Patient reports no complaints.  Contractions: Not present. Vag. Bleeding: None.  Movement: Present. Denies leaking of fluid.   The following portions of the patient's history were reviewed and updated as appropriate: allergies, current medications, past family history, past medical history, past social history, past surgical history and problem list.   Objective:   Vitals:   05/17/22 0909  BP: 128/87  Pulse: 79  Weight: 203 lb (92.1 kg)    Fetal Status: Fetal Heart Rate (bpm): 136 Fundal Height: 32 cm Movement: Present     General:  Alert, oriented and cooperative. Patient is in no acute distress.  Skin: Skin is warm and dry. No rash noted.   Cardiovascular: Normal heart rate noted  Respiratory: Normal respiratory effort, no problems with respiration noted  Abdomen: Soft, gravid, appropriate for gestational age.  Pain/Pressure: Absent     Pelvic: Cervical exam deferred        Extremities: Normal range of motion.  Edema: None  Mental Status: Normal mood and affect. Normal behavior. Normal judgment and thought content.   Assessment and Plan:  Pregnancy: [id number] at [redacted]w[redacted]d  1. Chronic hypertension affecting pregnancy  BP 128/87- she is taking labetalol 100 mg BID. Recommended she increase to TID dosing Continue MFM visits Discussed delivery @ 37-39 weeks Continue BASA   2. Supervision of high risk pregnancy, antepartum   Preterm labor symptoms and general obstetric precautions including but not limited to vaginal bleeding,  contractions, leaking of fluid and fetal movement were reviewed in detail with the patient. Please refer to After Visit Summary for other counseling recommendations.   No follow-ups on file.  Future Appointments  Date Time Provider Department Center  05/24/2022  8:30 AM [facility] NURSE [facility]  05/24/2022  8:45 AM [facility] US4 [facility]  05/31/2022  8:15 AM [facility] NURSE [facility]  05/31/2022  8:30 AM [facility] US2 [facility]  06/07/2022  1:30 PM [facility] NURSE [facility]  06/07/2022  1:45 PM [facility] US4 [facility]    Venia Carbon, NP

## 2022-05-24 ENCOUNTER — Ambulatory Visit: Payer: BC Managed Care – PPO | Admitting: *Deleted

## 2022-05-24 ENCOUNTER — Other Ambulatory Visit: Payer: Self-pay | Admitting: *Deleted

## 2022-05-24 ENCOUNTER — Ambulatory Visit: Payer: BC Managed Care – PPO | Attending: Maternal & Fetal Medicine

## 2022-05-24 VITALS — BP 117/69 | HR 78

## 2022-05-24 DIAGNOSIS — Z3A31 31 weeks gestation of pregnancy: Secondary | ICD-10-CM | POA: Insufficient documentation

## 2022-05-24 DIAGNOSIS — O09892 Supervision of other high risk pregnancies, second trimester: Secondary | ICD-10-CM | POA: Insufficient documentation

## 2022-05-24 DIAGNOSIS — O09522 Supervision of elderly multigravida, second trimester: Secondary | ICD-10-CM | POA: Insufficient documentation

## 2022-05-24 DIAGNOSIS — O09293 Supervision of pregnancy with other poor reproductive or obstetric history, third trimester: Secondary | ICD-10-CM | POA: Insufficient documentation

## 2022-05-24 DIAGNOSIS — O09523 Supervision of elderly multigravida, third trimester: Secondary | ICD-10-CM

## 2022-05-24 DIAGNOSIS — O09292 Supervision of pregnancy with other poor reproductive or obstetric history, second trimester: Secondary | ICD-10-CM | POA: Insufficient documentation

## 2022-05-24 DIAGNOSIS — O10013 Pre-existing essential hypertension complicating pregnancy, third trimester: Secondary | ICD-10-CM | POA: Diagnosis not present

## 2022-05-24 DIAGNOSIS — O09893 Supervision of other high risk pregnancies, third trimester: Secondary | ICD-10-CM | POA: Insufficient documentation

## 2022-05-24 DIAGNOSIS — O09213 Supervision of pregnancy with history of pre-term labor, third trimester: Secondary | ICD-10-CM | POA: Insufficient documentation

## 2022-05-24 DIAGNOSIS — Z3400 Encounter for supervision of normal first pregnancy, unspecified trimester: Secondary | ICD-10-CM | POA: Insufficient documentation

## 2022-05-24 DIAGNOSIS — O10912 Unspecified pre-existing hypertension complicating pregnancy, second trimester: Secondary | ICD-10-CM | POA: Insufficient documentation

## 2022-05-24 DIAGNOSIS — O10919 Unspecified pre-existing hypertension complicating pregnancy, unspecified trimester: Secondary | ICD-10-CM | POA: Insufficient documentation

## 2022-05-24 DIAGNOSIS — O99213 Obesity complicating pregnancy, third trimester: Secondary | ICD-10-CM | POA: Insufficient documentation

## 2022-05-24 DIAGNOSIS — O10913 Unspecified pre-existing hypertension complicating pregnancy, third trimester: Secondary | ICD-10-CM

## 2022-05-31 ENCOUNTER — Ambulatory Visit: Payer: BC Managed Care – PPO | Attending: Obstetrics

## 2022-05-31 ENCOUNTER — Ambulatory Visit (INDEPENDENT_AMBULATORY_CARE_PROVIDER_SITE_OTHER): Payer: BC Managed Care – PPO | Admitting: Obstetrics and Gynecology

## 2022-05-31 ENCOUNTER — Ambulatory Visit: Payer: BC Managed Care – PPO | Admitting: *Deleted

## 2022-05-31 VITALS — BP 126/77 | HR 80

## 2022-05-31 DIAGNOSIS — O09213 Supervision of pregnancy with history of pre-term labor, third trimester: Secondary | ICD-10-CM | POA: Diagnosis not present

## 2022-05-31 DIAGNOSIS — O321XX0 Maternal care for breech presentation, not applicable or unspecified: Secondary | ICD-10-CM | POA: Insufficient documentation

## 2022-05-31 DIAGNOSIS — O10013 Pre-existing essential hypertension complicating pregnancy, third trimester: Secondary | ICD-10-CM | POA: Diagnosis not present

## 2022-05-31 DIAGNOSIS — O09523 Supervision of elderly multigravida, third trimester: Secondary | ICD-10-CM | POA: Diagnosis not present

## 2022-05-31 DIAGNOSIS — O99213 Obesity complicating pregnancy, third trimester: Secondary | ICD-10-CM

## 2022-05-31 DIAGNOSIS — Z3A32 32 weeks gestation of pregnancy: Secondary | ICD-10-CM

## 2022-05-31 DIAGNOSIS — O09299 Supervision of pregnancy with other poor reproductive or obstetric history, unspecified trimester: Secondary | ICD-10-CM

## 2022-05-31 DIAGNOSIS — O09293 Supervision of pregnancy with other poor reproductive or obstetric history, third trimester: Secondary | ICD-10-CM | POA: Diagnosis not present

## 2022-05-31 DIAGNOSIS — Z3400 Encounter for supervision of normal first pregnancy, unspecified trimester: Secondary | ICD-10-CM

## 2022-05-31 DIAGNOSIS — O10912 Unspecified pre-existing hypertension complicating pregnancy, second trimester: Secondary | ICD-10-CM | POA: Insufficient documentation

## 2022-05-31 DIAGNOSIS — O99212 Obesity complicating pregnancy, second trimester: Secondary | ICD-10-CM | POA: Insufficient documentation

## 2022-05-31 DIAGNOSIS — O09892 Supervision of other high risk pregnancies, second trimester: Secondary | ICD-10-CM | POA: Insufficient documentation

## 2022-05-31 DIAGNOSIS — O10919 Unspecified pre-existing hypertension complicating pregnancy, unspecified trimester: Secondary | ICD-10-CM

## 2022-05-31 MED ORDER — ASPIRIN 81 MG PO TBEC: 81.0000 mg | DELAYED_RELEASE_TABLET | Freq: Every day | ORAL | 12 refills | Status: DC

## 2022-05-31 NOTE — Progress Notes (Signed)
   PRENATAL VISIT NOTE  Subjective:  Kara Brady is a 36 y.o. [phone] at [redacted]w[redacted]d being seen today for ongoing prenatal care.  She is currently monitored for the following issues for this low-risk pregnancy and has Chronic hypertension affecting pregnancy; Hx of preeclampsia, prior pregnancy, currently pregnant; History of preterm delivery; History of abnormal cervical Pap smear; Supervision of normal pregnancy; and Unwanted fertility on their problem list.  Patient reports no complaints.  Contractions: Not present. Vag. Bleeding: None.  Movement: Present. Denies leaking of fluid.   The following portions of the patient's history were reviewed and updated as appropriate: allergies, current medications, past family history, past medical history, past social history, past surgical history and problem list.   Objective:   Vitals:   05/31/22 1108  BP: 128/86  Pulse: 72  Weight: 203 lb (92.1 kg)    Fetal Status: Fetal Heart Rate (bpm): 141   Movement: Present     General:  Alert, oriented and cooperative. Patient is in no acute distress.  Skin: Skin is warm and dry. No rash noted.   Cardiovascular: Normal heart rate noted  Respiratory: Normal respiratory effort, no problems with respiration noted  Abdomen: Soft, gravid, appropriate for gestational age.  Pain/Pressure: Absent     Pelvic: Cervical exam deferred        Extremities: Normal range of motion.  Edema: None  Mental Status: Normal mood and affect. Normal behavior. Normal judgment and thought content.   Assessment and Plan:  Pregnancy: [id number] at [redacted]w[redacted]d  1. History of pre-eclampsia in prior pregnancy, currently pregnant  -  BP good today; although creeping up.  Will continue to monitor. She is taking TID dosing now of labetalol 100 mg.  - aspirin EC 81 MG tablet; Take 1 tablet (81 mg total) by mouth daily. Swallow whole. Start taking at [redacted] weeks gestation.  Dispense: 30 tablet; Refill: 12  2. Chronic hypertension affecting  pregnancy  -Continue antenatal testing and growth [place] with MFM - Reviewed precautions.  - aspirin EC 81 MG tablet; Take 1 tablet (81 mg total) by mouth daily. Swallow whole. Start taking at [redacted] weeks gestation.  Dispense: 30 tablet; Refill: 12    Preterm labor symptoms and general obstetric precautions including but not limited to vaginal bleeding, contractions, leaking of fluid and fetal movement were reviewed in detail with the patient. Please refer to After Visit Summary for other counseling recommendations.   No follow-ups on file.  Future Appointments  Date Time Provider Department Center  06/07/2022  3:00 PM [facility] NURSE [facility]  06/07/2022  3:15 PM [facility] NST [facility]  06/15/2022 10:30 AM [facility] NURSE [facility]  06/15/2022 10:45 AM [facility] NST [facility]  06/16/2022  9:50 AM Lennart Pall, MD [facility]  06/22/2022  2:45 PM [facility] NURSE [facility]  06/22/2022  3:00 PM [facility] US1 [facility]  06/28/2022  9:10 AM Scotland Korver, Harolyn Rutherford, NP [facility]  06/28/2022 12:30 PM [facility] NURSE [facility]  06/28/2022 12:45 PM [facility] US4 [facility]  07/05/2022  9:10 AM Jesaiah Fabiano, Harolyn Rutherford, NP [facility]  07/12/2022  9:10 AM Lorry Furber, Harolyn Rutherford, NP [facility]    Venia Carbon, NP

## 2022-06-07 ENCOUNTER — Other Ambulatory Visit: Payer: BC Managed Care – PPO

## 2022-06-07 ENCOUNTER — Ambulatory Visit: Payer: BC Managed Care – PPO

## 2022-06-07 ENCOUNTER — Ambulatory Visit: Payer: BC Managed Care – PPO | Admitting: *Deleted

## 2022-06-07 ENCOUNTER — Ambulatory Visit: Payer: BC Managed Care – PPO | Attending: Obstetrics and Gynecology | Admitting: *Deleted

## 2022-06-07 VITALS — BP 115/76 | HR 88

## 2022-06-07 DIAGNOSIS — O26893 Other specified pregnancy related conditions, third trimester: Secondary | ICD-10-CM | POA: Insufficient documentation

## 2022-06-07 DIAGNOSIS — O10913 Unspecified pre-existing hypertension complicating pregnancy, third trimester: Secondary | ICD-10-CM | POA: Diagnosis not present

## 2022-06-07 DIAGNOSIS — Z3A33 33 weeks gestation of pregnancy: Secondary | ICD-10-CM | POA: Diagnosis not present

## 2022-06-07 DIAGNOSIS — Z3400 Encounter for supervision of normal first pregnancy, unspecified trimester: Secondary | ICD-10-CM

## 2022-06-07 DIAGNOSIS — O09523 Supervision of elderly multigravida, third trimester: Secondary | ICD-10-CM | POA: Diagnosis not present

## 2022-06-07 NOTE — Procedures (Signed)
Kara Brady Feb 15, 1986 [redacted]w[redacted]d  Fetus A Non-Stress Test Interpretation for 06/07/22  Indication: Chronic Hypertenstion and Advanced Maternal Age >40 years  Fetal Heart Rate A Mode: External Baseline Rate (A): 130 bpm Variability: Moderate Accelerations: 15 x 15 Decelerations: None Multiple birth?: No  Uterine Activity Mode: Toco Contraction Frequency (min): 3-12 Contraction Duration (sec): 40-90 Contraction Quality: Mild Resting Tone Palpated: Relaxed Resting Time: Adequate  Interpretation (Fetal Testing) Nonstress Test Interpretation: Reactive Overall Impression: Reassuring for gestational age Comments: Tracing reviewed by Dr. Darra Lis

## 2022-06-15 ENCOUNTER — Ambulatory Visit: Payer: BC Managed Care – PPO | Attending: Obstetrics | Admitting: *Deleted

## 2022-06-15 ENCOUNTER — Ambulatory Visit: Payer: BC Managed Care – PPO | Admitting: *Deleted

## 2022-06-15 VITALS — BP 117/74 | HR 72

## 2022-06-15 DIAGNOSIS — O09523 Supervision of elderly multigravida, third trimester: Secondary | ICD-10-CM | POA: Diagnosis not present

## 2022-06-15 DIAGNOSIS — Z3A34 34 weeks gestation of pregnancy: Secondary | ICD-10-CM | POA: Insufficient documentation

## 2022-06-15 DIAGNOSIS — O10913 Unspecified pre-existing hypertension complicating pregnancy, third trimester: Secondary | ICD-10-CM | POA: Diagnosis not present

## 2022-06-15 NOTE — Procedures (Signed)
Kara Brady [birth year] [redacted]w[redacted]d  Fetus A Non-Stress Test Interpretation for 06/15/22  Indication: Chronic Hypertenstion and Advanced Maternal Age >40 years  Fetal Heart Rate A Mode: External Baseline Rate (A): 130 bpm Variability: Moderate Accelerations: 15 x 15 Decelerations: None Multiple birth?: No  Uterine Activity Mode: Toco Contraction Frequency (min): 1 uc during NST Contraction Duration (sec): 80 Contraction Quality: Mild Resting Tone Palpated: Relaxed  Interpretation (Fetal Testing) Nonstress Test Interpretation: Reactive Overall Impression: Reassuring for gestational age Comments: tracing reviewed byDr. Judeth Cornfield

## 2022-06-16 ENCOUNTER — Other Ambulatory Visit (HOSPITAL_COMMUNITY)
Admission: RE | Admit: 2022-06-16 | Discharge: 2022-06-16 | Disposition: A | Discharge status: Home / Self Care | Payer: BC Managed Care – PPO | Source: Ambulatory Visit | Attending: Obstetrics and Gynecology | Admitting: Obstetrics and Gynecology

## 2022-06-16 ENCOUNTER — Ambulatory Visit (INDEPENDENT_AMBULATORY_CARE_PROVIDER_SITE_OTHER): Payer: BC Managed Care – PPO | Admitting: Obstetrics and Gynecology

## 2022-06-16 VITALS — BP 133/85 | HR 71 | Wt 211.0 lb

## 2022-06-16 DIAGNOSIS — Z3493 Encounter for supervision of normal pregnancy, unspecified, third trimester: Secondary | ICD-10-CM | POA: Insufficient documentation

## 2022-06-16 DIAGNOSIS — Z3A34 34 weeks gestation of pregnancy: Secondary | ICD-10-CM

## 2022-06-16 DIAGNOSIS — O10919 Unspecified pre-existing hypertension complicating pregnancy, unspecified trimester: Secondary | ICD-10-CM

## 2022-06-16 LAB — OB RESULTS CONSOLE GBS: GBS: NEGATIVE

## 2022-06-16 NOTE — Progress Notes (Signed)
   PRENATAL VISIT NOTE  Subjective:  Kara Brady is a 36 y.o. [phone] at [redacted]w[redacted]d being seen today for ongoing prenatal care.  She is currently monitored for the following issues for this low-risk pregnancy and has Chronic hypertension affecting pregnancy; Hx of preeclampsia, prior pregnancy, currently pregnant; History of preterm delivery; History of abnormal cervical Pap smear; Supervision of normal pregnancy; and Unwanted fertility on their problem list.  Patient reports  she is doing well .  Contractions: Not present. Vag. Bleeding: None.  Movement: Present. Denies leaking of fluid.   The following portions of the patient's history were reviewed and updated as appropriate: allergies, current medications, past family history, past medical history, past social history, past surgical history and problem list.   Objective:   Vitals:   06/16/22 0955 06/16/22 1005  BP: (!) 148/89 133/85  Pulse: 71   Weight: 211 lb (95.7 kg)     Fetal Status: Fetal Heart Rate (bpm): 140   Movement: Present     General:  Alert, oriented and cooperative. Patient is in no acute distress.  Skin: Skin is warm and dry. No rash noted.   Cardiovascular: Normal heart rate noted  Respiratory: Normal respiratory effort, no problems with respiration noted  Abdomen: Soft, gravid, appropriate for gestational age.  Pain/Pressure: Absent      Assessment and Plan:  Pregnancy: [id number] at [redacted]w[redacted]d 1. Chronic hypertension affecting pregnancy Initial BP 148/89 > 133/85. Reports normal BP at home. Asymptomatic Labetalol 100mg  TID, ldASA Last growth 12/5 AGA, next scheduled 1/3 Continue weekly BPP/NST Plan for IOL at 39wk, ordered today  2. Encounter for supervision of low-risk pregnancy in third trimester GBS, GC/CT collected  Please refer to After Visit Summary for other counseling recommendations.   Return in about 2 weeks (around 06/30/2022) for ROB at 36 weeks.  Future Appointments  Date Time Provider Department  Center  06/22/2022  2:45 PM [facility] NURSE [facility]  06/22/2022  3:00 PM [facility] US1 [facility]  06/28/2022  9:10 AM Rasch, 08/27/2022, NP [facility]  06/28/2022 12:30 PM [facility] NURSE [facility]  06/28/2022 12:45 PM [facility] US4 [facility]  07/05/2022  9:10 AM Rasch, 07/07/2022, NP [facility]  07/12/2022  9:10 AM Rasch, 07/14/2022, NP [facility]  07/18/2022  8:10 AM 07/20/2022, MD [facility]    [facility], MD

## 2022-06-17 LAB — CERVICOVAGINAL ANCILLARY ONLY
Chlamydia: NEGATIVE
Comment: NEGATIVE
Comment: NORMAL
Neisseria Gonorrhea: NEGATIVE

## 2022-06-19 LAB — CULTURE, BETA STREP (GROUP B ONLY)
MICRO NUMBER:: 14367682
SPECIMEN QUALITY:: ADEQUATE

## 2022-06-20 NOTE — L&D Delivery Note (Signed)
Delivery Note 37 y.o. [id number] at [redacted]w[redacted]d admitted for IOL due to cHTN, polyhydramnios, LGA  At 5:18 AM a viable female was delivered via Vaginal, Spontaneous (Presentation: Left Occiput Anterior).  APGAR: 9, 10; weight 7 lb 9.3 oz (3440 g).   Placenta status: Spontaneous, Intact.  Cord: 3 vessels with the following complications: None.  Cord pH: not collected  Nuchal cord - reduced after delivery of the baby.  Anesthesia: None Episiotomy: None Lacerations: 1st degree;Perineal Suture Repair: 3.0 vicryl Est. Blood Loss (mL): 237  Mom to postpartum.  Baby to Couplet care / Skin to Skin.  Mom to remain NPO for now, in preparation for ppBTL.  Liliane Channel MD MPH OB Fellow, [place] for [place] 07/03/2022

## 2022-06-22 ENCOUNTER — Ambulatory Visit: Payer: BC Managed Care – PPO | Attending: Obstetrics

## 2022-06-22 ENCOUNTER — Ambulatory Visit: Payer: BC Managed Care – PPO | Admitting: *Deleted

## 2022-06-22 ENCOUNTER — Other Ambulatory Visit: Payer: Self-pay | Admitting: *Deleted

## 2022-06-22 ENCOUNTER — Encounter (INDEPENDENT_AMBULATORY_CARE_PROVIDER_SITE_OTHER): Payer: Self-pay

## 2022-06-22 VITALS — BP 136/90 | HR 86

## 2022-06-22 DIAGNOSIS — O10913 Unspecified pre-existing hypertension complicating pregnancy, third trimester: Secondary | ICD-10-CM | POA: Diagnosis not present

## 2022-06-22 DIAGNOSIS — O113 Pre-existing hypertension with pre-eclampsia, third trimester: Secondary | ICD-10-CM | POA: Insufficient documentation

## 2022-06-22 DIAGNOSIS — O09523 Supervision of elderly multigravida, third trimester: Secondary | ICD-10-CM

## 2022-06-22 DIAGNOSIS — O09213 Supervision of pregnancy with history of pre-term labor, third trimester: Secondary | ICD-10-CM

## 2022-06-22 DIAGNOSIS — Z3A35 35 weeks gestation of pregnancy: Secondary | ICD-10-CM | POA: Diagnosis not present

## 2022-06-22 DIAGNOSIS — O403XX0 Polyhydramnios, third trimester, not applicable or unspecified: Secondary | ICD-10-CM | POA: Insufficient documentation

## 2022-06-22 DIAGNOSIS — Z3400 Encounter for supervision of normal first pregnancy, unspecified trimester: Secondary | ICD-10-CM | POA: Insufficient documentation

## 2022-06-22 DIAGNOSIS — O10013 Pre-existing essential hypertension complicating pregnancy, third trimester: Secondary | ICD-10-CM

## 2022-06-22 DIAGNOSIS — O09293 Supervision of pregnancy with other poor reproductive or obstetric history, third trimester: Secondary | ICD-10-CM | POA: Diagnosis not present

## 2022-06-28 ENCOUNTER — Ambulatory Visit (INDEPENDENT_AMBULATORY_CARE_PROVIDER_SITE_OTHER): Payer: BC Managed Care – PPO | Admitting: Obstetrics and Gynecology

## 2022-06-28 ENCOUNTER — Encounter: Payer: Self-pay | Admitting: Obstetrics and Gynecology

## 2022-06-28 ENCOUNTER — Telehealth (HOSPITAL_COMMUNITY): Payer: Self-pay | Admitting: *Deleted

## 2022-06-28 ENCOUNTER — Ambulatory Visit: Payer: BC Managed Care – PPO | Attending: Obstetrics

## 2022-06-28 ENCOUNTER — Ambulatory Visit: Payer: BC Managed Care – PPO | Admitting: *Deleted

## 2022-06-28 ENCOUNTER — Encounter (HOSPITAL_COMMUNITY): Payer: Self-pay

## 2022-06-28 ENCOUNTER — Other Ambulatory Visit: Payer: Self-pay | Admitting: Women's Health

## 2022-06-28 ENCOUNTER — Encounter: Payer: Self-pay | Admitting: *Deleted

## 2022-06-28 VITALS — BP 136/84 | HR 89 | Wt 213.0 lb

## 2022-06-28 VITALS — BP 148/81 | HR 77

## 2022-06-28 DIAGNOSIS — O3663X0 Maternal care for excessive fetal growth, third trimester, not applicable or unspecified: Secondary | ICD-10-CM

## 2022-06-28 DIAGNOSIS — Z3400 Encounter for supervision of normal first pregnancy, unspecified trimester: Secondary | ICD-10-CM

## 2022-06-28 DIAGNOSIS — O10913 Unspecified pre-existing hypertension complicating pregnancy, third trimester: Secondary | ICD-10-CM

## 2022-06-28 DIAGNOSIS — O09893 Supervision of other high risk pregnancies, third trimester: Secondary | ICD-10-CM | POA: Diagnosis not present

## 2022-06-28 DIAGNOSIS — O09293 Supervision of pregnancy with other poor reproductive or obstetric history, third trimester: Secondary | ICD-10-CM | POA: Insufficient documentation

## 2022-06-28 DIAGNOSIS — Z3A36 36 weeks gestation of pregnancy: Secondary | ICD-10-CM | POA: Diagnosis not present

## 2022-06-28 DIAGNOSIS — O10013 Pre-existing essential hypertension complicating pregnancy, third trimester: Secondary | ICD-10-CM | POA: Diagnosis not present

## 2022-06-28 DIAGNOSIS — O403XX0 Polyhydramnios, third trimester, not applicable or unspecified: Secondary | ICD-10-CM

## 2022-06-28 DIAGNOSIS — O10919 Unspecified pre-existing hypertension complicating pregnancy, unspecified trimester: Secondary | ICD-10-CM

## 2022-06-28 DIAGNOSIS — Z3403 Encounter for supervision of normal first pregnancy, third trimester: Secondary | ICD-10-CM

## 2022-06-28 DIAGNOSIS — O09213 Supervision of pregnancy with history of pre-term labor, third trimester: Secondary | ICD-10-CM | POA: Diagnosis not present

## 2022-06-28 DIAGNOSIS — O09523 Supervision of elderly multigravida, third trimester: Secondary | ICD-10-CM | POA: Insufficient documentation

## 2022-06-28 NOTE — Progress Notes (Signed)
   PRENATAL VISIT NOTE  Subjective:  Kara Brady is a 37 y.o. [phone] at [redacted]w[redacted]d being seen today for ongoing prenatal care.  She is currently monitored for the following issues for this low-risk pregnancy and has Chronic hypertension affecting pregnancy; Hx of preeclampsia, prior pregnancy, currently pregnant; History of preterm delivery; History of abnormal cervical Pap smear; Supervision of normal pregnancy; and Unwanted fertility on their problem list.  Patient reports no complaints.  Contractions: Not present. Vag. Bleeding: None.  Movement: Present. Denies leaking of fluid.   The following portions of the patient's history were reviewed and updated as appropriate: allergies, current medications, past family history, past medical history, past social history, past surgical history and problem list.   Objective:   Vitals:   06/28/22 0901  BP: 136/84  Pulse: 89  Weight: 96.6 kg    Fetal Status: Fetal Heart Rate (bpm): 138   Movement: Present     General:  Alert, oriented and cooperative. Patient is in no acute distress.  Skin: Skin is warm and dry. No rash noted.   Cardiovascular: Normal heart rate noted  Respiratory: Normal respiratory effort, no problems with respiration noted  Abdomen: Soft, gravid, appropriate for gestational age.  Pain/Pressure: Absent     Pelvic: Cervical exam deferred        Extremities: Normal range of motion.  Edema: Trace  Mental Status: Normal mood and affect. Normal behavior. Normal judgment and thought content.   Assessment and Plan:  Pregnancy: [id number] at [redacted]w[redacted]d  1. Chronic hypertension affecting pregnancy  Currently on labetalol 100 mg TID; doing well. BP is borderline today 136/84 No HA or vision changes currenlty Induction scheduled for 39 weeks, patient requests 37-38. Fundal height today is 41; patient has mild polyhydramnios'  Reviewed patient with Dr. Annamaria Boots, MFM who is agreeable with 37/38 week induction of labor due to all of these problems  listed above.   2. Supervision of normal first pregnancy, antepartum  GBS negative    Preterm labor symptoms and general obstetric precautions including but not limited to vaginal bleeding, contractions, leaking of fluid and fetal movement were reviewed in detail with the patient. Please refer to After Visit Summary for other counseling recommendations.   No follow-ups on file.  Future Appointments  Date Time Provider [place]  06/28/2022 12:45 PM [facility] US4 [facility]  07/05/2022  9:10 AM Abdulkadir Emmanuel, Artist Pais, NP [facility]  07/05/2022  1:30 PM [facility] NURSE [facility]  07/05/2022  1:45 PM [facility] US7 [facility]  07/12/2022  9:10 AM Sharmon Cheramie, Artist Pais, NP [facility]  07/15/2022  7:15 AM [facility]-LD SCHED ROOM [facility]-INDC None  07/18/2022  8:10 AM Inez Catalina, MD [facility]    Noni Saupe, NP

## 2022-06-28 NOTE — Progress Notes (Signed)
Pt has been having headaches

## 2022-06-28 NOTE — Telephone Encounter (Signed)
Preadmission screen  

## 2022-06-29 ENCOUNTER — Telehealth (HOSPITAL_COMMUNITY): Payer: Self-pay | Admitting: *Deleted

## 2022-06-29 ENCOUNTER — Encounter (HOSPITAL_COMMUNITY): Payer: Self-pay | Admitting: *Deleted

## 2022-06-29 ENCOUNTER — Other Ambulatory Visit: Payer: Self-pay | Admitting: Advanced Practice Midwife

## 2022-06-29 NOTE — Telephone Encounter (Signed)
Preadmission screen  

## 2022-06-29 NOTE — Addendum Note (Signed)
Addended by: Gale Journey on: 06/29/2022 11:34 AM   Modules accepted: Orders

## 2022-07-02 ENCOUNTER — Inpatient Hospital Stay (HOSPITAL_COMMUNITY): Payer: BC Managed Care – PPO

## 2022-07-02 ENCOUNTER — Other Ambulatory Visit: Payer: Self-pay

## 2022-07-02 ENCOUNTER — Inpatient Hospital Stay (HOSPITAL_COMMUNITY)
Admission: RE | Admit: 2022-07-02 | Discharge: 2022-07-05 | DRG: 798 | Disposition: A | Discharge status: Home / Self Care | Payer: BC Managed Care – PPO | Attending: Obstetrics and Gynecology | Admitting: Obstetrics and Gynecology

## 2022-07-02 ENCOUNTER — Encounter (HOSPITAL_COMMUNITY): Payer: Self-pay | Admitting: Obstetrics and Gynecology

## 2022-07-02 DIAGNOSIS — O328XX0 Maternal care for other malpresentation of fetus, not applicable or unspecified: Secondary | ICD-10-CM | POA: Diagnosis not present

## 2022-07-02 DIAGNOSIS — O3663X0 Maternal care for excessive fetal growth, third trimester, not applicable or unspecified: Secondary | ICD-10-CM | POA: Diagnosis present

## 2022-07-02 DIAGNOSIS — O09523 Supervision of elderly multigravida, third trimester: Secondary | ICD-10-CM | POA: Diagnosis not present

## 2022-07-02 DIAGNOSIS — O403XX0 Polyhydramnios, third trimester, not applicable or unspecified: Secondary | ICD-10-CM | POA: Diagnosis present

## 2022-07-02 DIAGNOSIS — Z3493 Encounter for supervision of normal pregnancy, unspecified, third trimester: Secondary | ICD-10-CM

## 2022-07-02 DIAGNOSIS — Z302 Encounter for sterilization: Secondary | ICD-10-CM

## 2022-07-02 DIAGNOSIS — Z3490 Encounter for supervision of normal pregnancy, unspecified, unspecified trimester: Secondary | ICD-10-CM | POA: Diagnosis present

## 2022-07-02 DIAGNOSIS — Z8751 Personal history of pre-term labor: Secondary | ICD-10-CM

## 2022-07-02 DIAGNOSIS — O321XX0 Maternal care for breech presentation, not applicable or unspecified: Secondary | ICD-10-CM | POA: Diagnosis not present

## 2022-07-02 DIAGNOSIS — Z3A37 37 weeks gestation of pregnancy: Secondary | ICD-10-CM

## 2022-07-02 DIAGNOSIS — O09299 Supervision of pregnancy with other poor reproductive or obstetric history, unspecified trimester: Secondary | ICD-10-CM

## 2022-07-02 DIAGNOSIS — O1002 Pre-existing essential hypertension complicating childbirth: Principal | ICD-10-CM | POA: Diagnosis present

## 2022-07-02 DIAGNOSIS — Z3009 Encounter for other general counseling and advice on contraception: Secondary | ICD-10-CM | POA: Diagnosis present

## 2022-07-02 DIAGNOSIS — O10919 Unspecified pre-existing hypertension complicating pregnancy, unspecified trimester: Secondary | ICD-10-CM

## 2022-07-02 LAB — CBC
HCT: 37.8 % (ref 36.0–46.0)
Hemoglobin: 12.8 g/dL (ref 12.0–15.0)
MCH: 30.2 pg (ref 26.0–34.0)
MCHC: 33.9 g/dL (ref 30.0–36.0)
MCV: 89.2 fL (ref 80.0–100.0)
Platelets: 219 10*3/uL (ref 150–400)
RBC: 4.24 MIL/uL (ref 3.87–5.11)
RDW: 14.4 % (ref 11.5–15.5)
WBC: 8.3 10*3/uL (ref 4.0–10.5)
nRBC: 0 % (ref 0.0–0.2)

## 2022-07-02 LAB — COMPREHENSIVE METABOLIC PANEL
ALT: 13 U/L (ref 0–44)
AST: 28 U/L (ref 15–41)
Albumin: 2.5 g/dL — ABNORMAL LOW (ref 3.5–5.0)
Alkaline Phosphatase: 97 U/L (ref 38–126)
Anion gap: 11 (ref 5–15)
BUN: 10 mg/dL (ref 6–20)
CO2: 19 mmol/L — ABNORMAL LOW (ref 22–32)
Calcium: 9 mg/dL (ref 8.9–10.3)
Chloride: 106 mmol/L (ref 98–111)
Creatinine, Ser: 0.56 mg/dL (ref 0.44–1.00)
GFR, Estimated: >60 mL/min (ref >60)
Glucose, Bld: 99 mg/dL (ref 70–99)
Potassium: 4.1 mmol/L (ref 3.5–5.1)
Sodium: 136 mmol/L (ref 135–145)
Total Bilirubin: 0.2 mg/dL — ABNORMAL LOW (ref 0.3–1.2)
Total Protein: 5.9 g/dL — ABNORMAL LOW (ref 6.5–8.1)

## 2022-07-02 LAB — TYPE AND SCREEN
ABO/RH(D): O POS
Antibody Screen: NEGATIVE

## 2022-07-02 MED ORDER — ACETAMINOPHEN 325 MG PO TABS: 650.0000 mg | ORAL_TABLET | ORAL | Status: DC | PRN

## 2022-07-02 MED ORDER — LACTATED RINGERS IV SOLN: 500.0000 mL | INTRAVENOUS | Status: DC | PRN

## 2022-07-02 MED ORDER — TERBUTALINE SULFATE 1 MG/ML IJ SOLN: 0.2500 mg | Freq: Once | INTRAMUSCULAR | Status: DC | PRN

## 2022-07-02 MED ORDER — LACTATED RINGERS IV SOLN: INTRAVENOUS | Status: DC

## 2022-07-02 MED ORDER — ONDANSETRON HCL 4 MG/2ML IJ SOLN: 4.0000 mg | Freq: Four times a day (QID) | INTRAMUSCULAR | Status: DC | PRN

## 2022-07-02 MED ORDER — MISOPROSTOL 25 MCG QUARTER TABLET
25.0000 ug | ORAL_TABLET | Freq: Once | ORAL | Status: AC
  Administered 2022-07-03: 25 ug via VAGINAL
  Filled 2022-07-02 (×2): qty 1

## 2022-07-02 MED ORDER — LIDOCAINE HCL (PF) 1 % IJ SOLN
30.0000 mL | INTRAMUSCULAR | Status: AC | PRN
  Administered 2022-07-03: 30 mL via SUBCUTANEOUS
  Filled 2022-07-02: qty 30

## 2022-07-02 MED ORDER — LABETALOL HCL 100 MG PO TABS
100.0000 mg | ORAL_TABLET | Freq: Three times a day (TID) | ORAL | Status: DC
  Administered 2022-07-02: 100 mg via ORAL
  Filled 2022-07-02: qty 1

## 2022-07-02 MED ORDER — OXYTOCIN-SODIUM CHLORIDE 30-0.9 UT/500ML-% IV SOLN
2.5000 [IU]/h | INTRAVENOUS | Status: DC
  Administered 2022-07-03: 2.5 [IU]/h via INTRAVENOUS
  Filled 2022-07-02: qty 500

## 2022-07-02 MED ORDER — OXYCODONE-ACETAMINOPHEN 5-325 MG PO TABS: 2.0000 | ORAL_TABLET | ORAL | Status: DC | PRN

## 2022-07-02 MED ORDER — SOD CITRATE-CITRIC ACID 500-334 MG/5ML PO SOLN: 30.0000 mL | ORAL | Status: DC | PRN

## 2022-07-02 MED ORDER — OXYCODONE-ACETAMINOPHEN 5-325 MG PO TABS: 1.0000 | ORAL_TABLET | ORAL | Status: DC | PRN

## 2022-07-02 MED ORDER — OXYTOCIN BOLUS FROM INFUSION
333.0000 mL | Freq: Once | INTRAVENOUS | Status: AC
  Administered 2022-07-03: 333 mL via INTRAVENOUS

## 2022-07-02 MED ORDER — MISOPROSTOL 50MCG HALF TABLET
50.0000 ug | ORAL_TABLET | Freq: Once | ORAL | Status: AC
  Administered 2022-07-03: 50 ug via ORAL
  Filled 2022-07-02 (×2): qty 1

## 2022-07-02 NOTE — Procedures (Addendum)
Date of procedure: 07/02/2022  Pre-procedure diagnosis: 1. Single live intrauterine pregnancy at [redacted]w[redacted]d 2. Breech presentation  Post-procedure diagnosis: 1. Single live intrauterine pregnancy at [redacted]w[redacted]d 2. Cephalic  Procedure: External Cephalic Version Anesthesia: Epidural Surgeon: Dr. Radene Gunning Assistant: Dr. Mikki Santee. An experienced assistant was required given the standard of surgical care given the complexity of the case.  This assistant was needed for exposure, dissection, suctioning, retraction, instrument exchange, and for overall help during the procedure.   Findings: Fetus in footling breech presentation; stable, reactive fetal heart rate pre and post procedure EBL: N/A  IVF: N/A  UOP: N/A Complications: None  Indication for procedure: Pt presents to L&D for IOL due to [facility]. Found incidentally to be breech but had been cephalicin prior US. Pregnancy also complicated by LGA and polyhydramnios.  Discussed option for ECV due to fetal malpresentation.  A reactive FHT was obtained prior to procedure.  Risks of ECV including abnormal fetal heart rate, PROM, abruption, injury to fetus, possible emergency c-section, and failed ECV were discussed with the pt and verbal informed consent was obtained.  Informed consent was also obtained for c-section verbally but reviewed unlikely this would an immediate result.   Details of Procedure: A bedside ultrasound was performed which confirmed the single intrauterine pregnancy and footling breech presentation.  There was noted to be adequate fluid.  Using manual pressure, the fetus was manipulated with gentle pressure from the palms against the buttock and posterior occiput to stimulate a backward roll.  One attempt was made for version and fetal HR was obtained after first attempt and it was normal. First attempt was unsuccessful. I then attempted two times with similar technique and after each attempt FHT were obtained and normal by [place].  The  attempts were successful after the second attempt. FHT reactive after attempted version.  The patient and fetus tolerated the procedure well.  RH status: Positive  Radene Gunning, MD Attending [place], [facility] for [facility], [place]

## 2022-07-02 NOTE — H&P (Shared)
Kara Brady is a 37 y.o. female [phone]  at 106w1d presenting for IOL for cHTN on labetalol. BP well controlled during pregnancy but borderline BP at 39 weeks with hx SIPE in previous pregnancy, suspected macrosomia and polyhydramnios with current pregnancy, so IOL at 37 weeks per MFM.    [place] MFM OB Follow up on 06/22/22 at 35 weeks: FHR 024, cephalic position, posterior placenta, AFI 26.1.  Nursing Staff Provider  Office Location  [place] Dating  Early U/S  [facility] Model [x ] Traditional [ ]  Centering [ ]  Mom-Baby Dyad    Language  English Anatomy US  Normal and will get serial [place]  Flu Vaccine  Declines; but may reconsider Genetic/Carrier Screen  NIPS:   Low risk female AFP: normal [place]: Negative   TDaP Vaccine   Done 05/02/22 Hgb A1C or  GTT Early  Third trimester:  normal   COVID Vaccine    LAB RESULTS   Rhogam  NA Blood Type --/--/O POS (08/10 1925)   Baby Feeding Plan Bottle Antibody NEG (08/10 1925)  Contraception BTL Rubella 1.26 (07/11 0855)  Circumcision unsure RPR NON REACTIVE (08/10 1916)   Pediatrician  [facility] HBsAg NON-REACTIVE (07/11 0855)   Support Person Milbert Coulter HCVAb Neg  Prenatal Classes  HIV NON-REACTIVE (07/11 0855)     BTL Consent Has BCBS GBS  Negative 12/28  VBAC Consent n/a Pap 9/22       DME Rx [ x] BP cuff [ ]  Weight Scale Waterbirth  decllines  PHQ9 & GAD7 [x  ] new OB [  ] 28 weeks  [  ] 36 weeks Induction  [ ]  Orders Entered [ ] Foley Y/N   OB History     Gravida  5   Para  3   Term  2   Preterm  1   AB  1   Living  3      SAB  1   IAB      Ectopic      Multiple  0   Live Births  3          Past Medical History:  Diagnosis Date   Headache    History of migraine headaches    Hypertension    Pregnancy induced hypertension    Past Surgical History:  Procedure Laterality Date   BREAST BIOPSY     Family History: family history includes Diabetes in her paternal grandfather; Hypertension in her mother; Stroke in her maternal  aunt. Social History:  reports that she has never smoked. She has never used smokeless tobacco. She reports that she does not drink alcohol and does not use drugs.     Maternal Diabetes: No Genetic Screening: Normal Maternal Ultrasounds/Referrals: Normal Fetal Ultrasounds or other Referrals:  None Maternal Substance Abuse:  No Significant Maternal Medications:  Meds include: Other:  Significant Maternal Lab Results:  Group B Strep negative Number of Prenatal Visits:greater than 3 verified prenatal visits Other Comments:   On labetalol  Review of Systems History   Last menstrual period 10/15/2021, currently breastfeeding. Exam Physical Exam  Prenatal labs: ABO, Rh: O/RH(D) POSITIVE/-- (07/11 0855) Antibody: NO ANTIBODIES DETECTED (07/11 0855) Rubella: 1.26 (07/11 0855) RPR: NON-REACTIVE (11/13 0821)  HBsAg: NON-REACTIVE (07/11 0855)  HIV: NON-REACTIVE (11/13 0821)  GBS: Negative/-- (12/28 0000)   Assessment/Plan: Kara Brady 07/02/2022, 7:51 PM

## 2022-07-03 ENCOUNTER — Encounter (HOSPITAL_COMMUNITY): Payer: Self-pay | Admitting: Obstetrics and Gynecology

## 2022-07-03 DIAGNOSIS — O09523 Supervision of elderly multigravida, third trimester: Secondary | ICD-10-CM

## 2022-07-03 DIAGNOSIS — O3663X0 Maternal care for excessive fetal growth, third trimester, not applicable or unspecified: Secondary | ICD-10-CM

## 2022-07-03 DIAGNOSIS — O403XX0 Polyhydramnios, third trimester, not applicable or unspecified: Secondary | ICD-10-CM

## 2022-07-03 DIAGNOSIS — Z3A37 37 weeks gestation of pregnancy: Secondary | ICD-10-CM

## 2022-07-03 DIAGNOSIS — O1002 Pre-existing essential hypertension complicating childbirth: Secondary | ICD-10-CM

## 2022-07-03 DIAGNOSIS — O321XX0 Maternal care for breech presentation, not applicable or unspecified: Secondary | ICD-10-CM | POA: Insufficient documentation

## 2022-07-03 LAB — RPR: RPR Ser Ql: NONREACTIVE

## 2022-07-03 MED ORDER — ZOLPIDEM TARTRATE 5 MG PO TABS: 5.0000 mg | ORAL_TABLET | Freq: Every evening | ORAL | Status: DC | PRN

## 2022-07-03 MED ORDER — BENZOCAINE-MENTHOL 20-0.5 % EX AERO
1.0000 | INHALATION_SPRAY | CUTANEOUS | Status: DC | PRN
  Administered 2022-07-03: 1 via TOPICAL
  Filled 2022-07-03: qty 56

## 2022-07-03 MED ORDER — ONDANSETRON HCL 4 MG/2ML IJ SOLN: 4.0000 mg | INTRAMUSCULAR | Status: DC | PRN

## 2022-07-03 MED ORDER — SENNOSIDES-DOCUSATE SODIUM 8.6-50 MG PO TABS
2.0000 | ORAL_TABLET | ORAL | Status: DC
  Administered 2022-07-03 – 2022-07-05 (×3): 2 via ORAL
  Filled 2022-07-03 (×3): qty 2

## 2022-07-03 MED ORDER — SODIUM CHLORIDE 0.9% FLUSH: 3.0000 mL | INTRAVENOUS | Status: DC | PRN

## 2022-07-03 MED ORDER — FUROSEMIDE 20 MG PO TABS
20.0000 mg | ORAL_TABLET | Freq: Every day | ORAL | Status: DC
  Administered 2022-07-03 – 2022-07-05 (×3): 20 mg via ORAL
  Filled 2022-07-03 (×3): qty 1

## 2022-07-03 MED ORDER — IBUPROFEN 600 MG PO TABS
600.0000 mg | ORAL_TABLET | Freq: Four times a day (QID) | ORAL | Status: DC
  Administered 2022-07-03 – 2022-07-05 (×7): 600 mg via ORAL
  Filled 2022-07-03 (×8): qty 1

## 2022-07-03 MED ORDER — NIFEDIPINE ER OSMOTIC RELEASE 30 MG PO TB24
30.0000 mg | ORAL_TABLET | Freq: Every day | ORAL | Status: DC
  Administered 2022-07-03 – 2022-07-05 (×3): 30 mg via ORAL
  Filled 2022-07-03 (×3): qty 1

## 2022-07-03 MED ORDER — WITCH HAZEL-GLYCERIN EX PADS: 1.0000 | MEDICATED_PAD | CUTANEOUS | Status: DC | PRN

## 2022-07-03 MED ORDER — SODIUM CHLORIDE 0.9% FLUSH: 3.0000 mL | Freq: Two times a day (BID) | INTRAVENOUS | Status: DC

## 2022-07-03 MED ORDER — SODIUM CHLORIDE 0.9 % IV SOLN: INTRAVENOUS | Status: DC | PRN

## 2022-07-03 MED ORDER — ACETAMINOPHEN 325 MG PO TABS
650.0000 mg | ORAL_TABLET | ORAL | Status: DC | PRN
  Administered 2022-07-04 – 2022-07-05 (×3): 650 mg via ORAL
  Filled 2022-07-03 (×3): qty 2

## 2022-07-03 MED ORDER — PRENATAL MULTIVITAMIN CH
1.0000 | ORAL_TABLET | Freq: Every day | ORAL | Status: DC
  Administered 2022-07-03 – 2022-07-05 (×3): 1 via ORAL
  Filled 2022-07-03 (×3): qty 1

## 2022-07-03 MED ORDER — DIPHENHYDRAMINE HCL 25 MG PO CAPS: 25.0000 mg | ORAL_CAPSULE | Freq: Four times a day (QID) | ORAL | Status: DC | PRN

## 2022-07-03 MED ORDER — SIMETHICONE 80 MG PO CHEW: 80.0000 mg | CHEWABLE_TABLET | ORAL | Status: DC | PRN

## 2022-07-03 MED ORDER — COCONUT OIL OIL: 1.0000 | TOPICAL_OIL | Status: DC | PRN

## 2022-07-03 MED ORDER — DIBUCAINE (PERIANAL) 1 % EX OINT: 1.0000 | TOPICAL_OINTMENT | CUTANEOUS | Status: DC | PRN

## 2022-07-03 MED ORDER — ONDANSETRON HCL 4 MG PO TABS: 4.0000 mg | ORAL_TABLET | ORAL | Status: DC | PRN

## 2022-07-03 NOTE — Lactation Note (Signed)
This note was copied from a baby's chart. Lactation Consultation Note  Patient Name: Kara Brady [id number]'U Date: 07/03/2022 Age:38 hours  Feeding Mother's Current Feeding Choice: Formula (Verified with RN Harriette Ohara Aube))  Consult Status Consult Status: Complete    Kara Brady [place] 07/03/2022, 8:44 AM

## 2022-07-03 NOTE — Discharge Summary (Signed)
Postpartum Discharge Summary     Patient Name: Kara Brady DOB: 16-Oct-1985 MRN: [id number]  Date of admission: 07/02/2022 Delivery date:07/03/2022  Delivering provider: Stormy Card  Date of discharge: 07/05/2022  Admitting diagnosis: Encounter for induction of labor [Z34.90] Intrauterine pregnancy: [redacted]w[redacted]d     Secondary diagnosis:  Principal Problem:   Vaginal delivery Active Problems:   Chronic hypertension affecting pregnancy   Hx of preeclampsia, prior pregnancy, currently pregnant   History of preterm delivery   Supervision of normal pregnancy   Unwanted fertility   Encounter for induction of labor   Breech presentation of fetus  Additional problems: cHTN    Discharge diagnosis: Term Pregnancy Delivered and CHTN                                              Post partum procedures:postpartum tubal ligation Augmentation: AROM, Cytotec, and IP Foley Complications: None  Hospital course: Induction of Labor With Vaginal Delivery   37 y.o. yo [id number] at [redacted]w[redacted]d was admitted to the hospital 07/02/2022 for induction of labor.  Indication for induction: AMA and cHTN .  Patient had an labor course complicated by: none Membrane Rupture Time/Date: 4:08 AM ,07/03/2022   Delivery Method:Vaginal, Spontaneous  Episiotomy: None  Lacerations:  1st degree;Perineal  Details of delivery can be found in separate delivery note.  Patient had a uncomplicated  postpartum course. Patient is discharged home 07/05/22.  Newborn Data: Birth date:07/03/2022  Birth time:5:18 AM  Gender:Female  Living status:Living  Apgars:9 ,10  Weight:3440 g   Magnesium Sulfate received: No BMZ received: No Rhophylac:N/A MMR:N/A, Rubella Immune T-DaP:Given prenatally Flu: No Transfusion:No  Physical exam  Vitals:   07/04/22 2103 07/04/22 2300 07/05/22 0235 07/05/22 0624  BP: (!) 141/84 (!) 142/78 (!) 143/79 (!) 143/89  Pulse: 79 60 62 73  Resp: 18 18 18 18   Temp: 98.1 F (36.7 C) 97.7 F (36.5 C)  97.8 F (36.6 C) 98 F (36.7 C)  TempSrc: Oral Oral Oral Oral  SpO2: 96% 98% 96% 100%  Weight:      Height:       General: alert, cooperative, and no distress Lochia: appropriate Uterine Fundus: firm Incision: Healing well with no significant drainage, Dressing is clean, dry, and intact DVT Evaluation: No evidence of DVT seen on physical exam. Labs: Lab Results  Component Value Date   WBC 8.3 07/02/2022   HGB 12.8 07/02/2022   HCT 37.8 07/02/2022   MCV 89.2 07/02/2022   PLT 219 07/02/2022      Latest Ref Rng & Units 07/02/2022    8:11 PM  CMP  Glucose 70 - 99 mg/dL 99   BUN 6 - 20 mg/dL 10   Creatinine 0.44 - 1.00 mg/dL 0.56   Sodium 135 - 145 mmol/L 136   Potassium 3.5 - 5.1 mmol/L 4.1   Chloride 98 - 111 mmol/L 106   CO2 22 - 32 mmol/L 19   Calcium 8.9 - 10.3 mg/dL 9.0   Total Protein 6.5 - 8.1 g/dL 5.9   Total Bilirubin 0.3 - 1.2 mg/dL 0.2   Alkaline Phos 38 - 126 U/L 97   AST 15 - 41 U/L 28   ALT 0 - 44 U/L 13    Edinburgh Score:    07/03/2022    9:13 PM  Edinburgh Postnatal Depression Scale Screening Tool  I  have been able to laugh and see the funny side of things. 2  I have looked forward with enjoyment to things. 2  I have blamed myself unnecessarily when things went wrong. 2  I have been anxious or worried for no good reason. 2  I have felt scared or panicky for no good reason. 2  Things have been getting on top of me. 1  I have been so unhappy that I have had difficulty sleeping. 2  I have felt sad or miserable. 2  I have been so unhappy that I have been crying. 2  The thought of harming myself has occurred to me. 0  Edinburgh Postnatal Depression Scale Total 17     After visit meds:  Allergies as of 07/05/2022   No Known Allergies      Medication List     STOP taking these medications    aspirin EC 81 MG tablet   labetalol 100 MG tablet Commonly known as: NORMODYNE   PRENATAL PO       TAKE these medications    acetaminophen 325  MG tablet Commonly known as: Tylenol Take 2 tablets (650 mg total) by mouth every 4 (four) hours as needed (for pain scale < 4).   benzocaine-Menthol 20-0.5 % Aero Commonly known as: DERMOPLAST Apply 1 Application topically as needed for irritation (perineal discomfort).   famotidine 20 MG tablet Commonly known as: PEPCID Take 1 tablet (20 mg total) by mouth 2 (two) times daily.   furosemide 20 MG tablet Commonly known as: LASIX Take 1 tablet (20 mg total) by mouth daily.   ibuprofen 600 MG tablet Commonly known as: ADVIL Take 1 tablet (600 mg total) by mouth every 6 (six) hours.   NIFEdipine 60 MG 24 hr tablet Commonly known as: ADALAT CC Take 1 tablet (60 mg total) by mouth daily.   oxyCODONE 5 MG immediate release tablet Commonly known as: Oxy IR/ROXICODONE Take 1 tablet (5 mg total) by mouth every 4 (four) hours as needed for moderate pain, severe pain or breakthrough pain.         Discharge home in stable condition Infant Feeding: Bottle Infant Disposition:home with mother Discharge instruction: per After Visit Summary and Postpartum booklet. Activity: Advance as tolerated. Pelvic rest for 6 weeks.  Diet: routine diet Future Appointments: Future Appointments  Date Time Provider [place]  07/12/2022  9:10 AM Rasch, Artist Pais, NP [facility]  07/18/2022  8:10 AM Inez Catalina, MD [facility]   Follow up Visit:  The following message was sent to [facility] by Mikki Santee, MD  Please schedule this patient for a In person postpartum visit in 6 weeks with the following provider: Any provider. Additional Postpartum F/U:BP check 1 week  High risk pregnancy complicated by: HTN and AMA, LGA Delivery mode:  Vaginal, Spontaneous  Anticipated Birth Control:   BTLpp planned   07/05/2022 Concepcion Living, MD

## 2022-07-03 NOTE — Progress Notes (Signed)
Labor Progress Note Kara Brady is a 37 y.o. [phone] at [redacted]w[redacted]d presented for IOL for cHTN, LGA S: doing well. Feels contractions mildly.  O:  BP 133/88   Pulse 80   Temp 98.1 F (36.7 C) (Oral)   Resp 18   Ht 5\' 2"  (1.575 m)   Wt 97.3 kg   LMP 10/15/2021   BMI 39.23 kg/m   BP Readings from Last 3 Encounters:  07/03/22 133/88  06/28/22 (!) 148/81  06/28/22 136/84     EFM: 125bpm/moderate/+accels, no decels  Contractions: every 2-4 mins per toco. Also having some coupling.  CVE: Dilation: 5 Effacement (%): 50 Cervical Position: Posterior Station: -3 Presentation: Vertex Exam by:: Dr. Trina Ao   A&P: 37 y.o. [id number] [redacted]w[redacted]d Iol for cHTN. #Labor: Progressing well. S/p cyto 50/25, FB.  AROM performed at 0401 with clear fluid.  Plan to watch contraction pattern. If not adequate over the next 1 hr, consider pitocin augmentation. #Pain: desires to avoid medications #FWB: Cat 1 #GBS negative  cHTN -  blood pressures mildly elevated intermittently.none in severe range.  Continue to monitor.  Liliane Channel MD MPH OB Fellow, [place] for [place] 07/03/2022

## 2022-07-04 ENCOUNTER — Other Ambulatory Visit: Payer: Self-pay

## 2022-07-04 ENCOUNTER — Inpatient Hospital Stay (HOSPITAL_COMMUNITY): Payer: BC Managed Care – PPO | Admitting: Anesthesiology

## 2022-07-04 ENCOUNTER — Encounter (HOSPITAL_COMMUNITY)
Admission: RE | Disposition: A | Discharge status: Home / Self Care | Payer: Self-pay | Source: Home / Self Care | Attending: Obstetrics and Gynecology

## 2022-07-04 ENCOUNTER — Encounter (HOSPITAL_COMMUNITY): Payer: Self-pay | Admitting: Family Medicine

## 2022-07-04 DIAGNOSIS — Z302 Encounter for sterilization: Secondary | ICD-10-CM

## 2022-07-04 HISTORY — PX: TUBAL LIGATION: SHX77

## 2022-07-04 SURGERY — LIGATION, FALLOPIAN TUBE, POSTPARTUM
Anesthesia: General

## 2022-07-04 MED ORDER — SODIUM CHLORIDE 0.9 % IR SOLN
Status: DC | PRN
  Administered 2022-07-04: 1 via INTRAVESICAL

## 2022-07-04 MED ORDER — FENTANYL CITRATE (PF) 100 MCG/2ML IJ SOLN
INTRAMUSCULAR | Status: DC | PRN
  Administered 2022-07-04: 50 ug via INTRAVENOUS
  Administered 2022-07-04: 35 ug via INTRAVENOUS

## 2022-07-04 MED ORDER — MIDAZOLAM HCL 2 MG/2ML IJ SOLN
INTRAMUSCULAR | Status: AC
  Filled 2022-07-04: qty 2

## 2022-07-04 MED ORDER — OXYCODONE HCL 5 MG/5ML PO SOLN: 5.0000 mg | Freq: Once | ORAL | Status: DC | PRN

## 2022-07-04 MED ORDER — DEXMEDETOMIDINE HCL IN NACL 80 MCG/20ML IV SOLN
INTRAVENOUS | Status: DC | PRN
  Administered 2022-07-04: 8 ug via BUCCAL
  Administered 2022-07-04: 4 ug via BUCCAL

## 2022-07-04 MED ORDER — OXYCODONE HCL 5 MG PO TABS: 5.0000 mg | ORAL_TABLET | Freq: Once | ORAL | Status: DC | PRN

## 2022-07-04 MED ORDER — BUPIVACAINE HCL (PF) 0.75 % IJ SOLN
INTRAMUSCULAR | Status: DC | PRN
  Administered 2022-07-04: 1.6 mL via INTRATHECAL

## 2022-07-04 MED ORDER — DEXAMETHASONE SODIUM PHOSPHATE 10 MG/ML IJ SOLN
INTRAMUSCULAR | Status: DC | PRN
  Administered 2022-07-04 (×2): 5 mg via INTRAVENOUS

## 2022-07-04 MED ORDER — KETOROLAC TROMETHAMINE 30 MG/ML IJ SOLN
INTRAMUSCULAR | Status: DC | PRN
  Administered 2022-07-04: 30 mg via INTRAVENOUS

## 2022-07-04 MED ORDER — OXYCODONE HCL 5 MG PO TABS
5.0000 mg | ORAL_TABLET | ORAL | Status: DC | PRN
  Administered 2022-07-04 – 2022-07-05 (×4): 5 mg via ORAL
  Filled 2022-07-04: qty 1
  Filled 2022-07-04: qty 2
  Filled 2022-07-04 (×2): qty 1

## 2022-07-04 MED ORDER — DEXAMETHASONE SODIUM PHOSPHATE 10 MG/ML IJ SOLN
INTRAMUSCULAR | Status: AC
  Filled 2022-07-04: qty 1

## 2022-07-04 MED ORDER — LACTATED RINGERS IV SOLN: INTRAVENOUS | Status: DC

## 2022-07-04 MED ORDER — LIDOCAINE-EPINEPHRINE (PF) 2 %-1:200000 IJ SOLN
INTRAMUSCULAR | Status: AC
  Filled 2022-07-04: qty 20

## 2022-07-04 MED ORDER — FENTANYL CITRATE (PF) 100 MCG/2ML IJ SOLN
INTRAMUSCULAR | Status: AC
  Filled 2022-07-04: qty 2

## 2022-07-04 MED ORDER — FENTANYL CITRATE (PF) 100 MCG/2ML IJ SOLN
INTRAMUSCULAR | Status: DC | PRN
  Administered 2022-07-04: 15 ug via INTRATHECAL

## 2022-07-04 MED ORDER — DEXMEDETOMIDINE HCL IN NACL 80 MCG/20ML IV SOLN
INTRAVENOUS | Status: AC
  Filled 2022-07-04: qty 20

## 2022-07-04 MED ORDER — KETOROLAC TROMETHAMINE 30 MG/ML IJ SOLN: 30.0000 mg | Freq: Once | INTRAMUSCULAR | Status: DC | PRN

## 2022-07-04 MED ORDER — PROMETHAZINE HCL 25 MG/ML IJ SOLN: 6.2500 mg | INTRAMUSCULAR | Status: DC | PRN

## 2022-07-04 MED ORDER — MIDAZOLAM HCL 2 MG/2ML IJ SOLN
INTRAMUSCULAR | Status: DC | PRN
  Administered 2022-07-04 (×2): 1 mg via INTRAVENOUS

## 2022-07-04 MED ORDER — METOCLOPRAMIDE HCL 10 MG PO TABS
10.0000 mg | ORAL_TABLET | Freq: Once | ORAL | Status: AC
  Administered 2022-07-04: 10 mg via ORAL
  Filled 2022-07-04: qty 1

## 2022-07-04 MED ORDER — FAMOTIDINE 20 MG PO TABS
40.0000 mg | ORAL_TABLET | Freq: Once | ORAL | Status: AC
  Administered 2022-07-04: 40 mg via ORAL
  Filled 2022-07-04: qty 2

## 2022-07-04 MED ORDER — KETOROLAC TROMETHAMINE 30 MG/ML IJ SOLN
INTRAMUSCULAR | Status: AC
  Filled 2022-07-04: qty 1

## 2022-07-04 MED ORDER — HYDROMORPHONE HCL 1 MG/ML IJ SOLN: 0.2500 mg | INTRAMUSCULAR | Status: DC | PRN

## 2022-07-04 MED ORDER — STERILE WATER FOR IRRIGATION IR SOLN
Status: DC | PRN
  Administered 2022-07-04: 1000 mL

## 2022-07-04 MED ORDER — ONDANSETRON HCL 4 MG/2ML IJ SOLN
INTRAMUSCULAR | Status: AC
  Filled 2022-07-04: qty 2

## 2022-07-04 MED ORDER — PROPOFOL 10 MG/ML IV BOLUS
INTRAVENOUS | Status: AC
  Filled 2022-07-04: qty 40

## 2022-07-04 MED ORDER — ONDANSETRON HCL 4 MG/2ML IJ SOLN
INTRAMUSCULAR | Status: DC | PRN
  Administered 2022-07-04: 4 mg via INTRAVENOUS

## 2022-07-04 SURGICAL SUPPLY — 21 items
DRSG OPSITE POSTOP 3X4 (GAUZE/BANDAGES/DRESSINGS) ×1 IMPLANT
DURAPREP 26ML APPLICATOR (WOUND CARE) ×1 IMPLANT
GLOVE BIOGEL PI IND STRL 7.0 (GLOVE) ×2 IMPLANT
GLOVE SURG SS PI 7.0 STRL IVOR (GLOVE) ×1 IMPLANT
GOWN STRL REUS W/TWL LRG LVL3 (GOWN DISPOSABLE) ×1 IMPLANT
LIGASURE IMPACT 36 18CM CVD LR (INSTRUMENTS) IMPLANT
NEEDLE HYPO 22GX1.5 SAFETY (NEEDLE) ×1 IMPLANT
NS IRRIG 1000ML POUR BTL (IV SOLUTION) ×1 IMPLANT
PACK ABDOMINAL MINOR (CUSTOM PROCEDURE TRAY) ×1 IMPLANT
PROTECTOR NERVE ULNAR (MISCELLANEOUS) ×1 IMPLANT
SPONGE GAUZE 2X2 8PLY STRL LF (GAUZE/BANDAGES/DRESSINGS) ×1 IMPLANT
SPONGE LAP 4X18 RFD (DISPOSABLE) ×1 IMPLANT
SUT MON AB 2-0 CT1 36 (SUTURE) ×1 IMPLANT
SUT PLAIN 0 NONE (SUTURE) IMPLANT
SUT VIC AB 0 CT1 27 (SUTURE) ×1
SUT VIC AB 0 CT1 27XBRD ANBCTR (SUTURE) ×1 IMPLANT
SUT VIC AB 3-0 PS2 18 (SUTURE) ×1 IMPLANT
SUT VICRYL 0 UR6 27IN ABS (SUTURE) ×1 IMPLANT
SYR CONTROL 10ML LL (SYRINGE) ×1 IMPLANT
TOWEL OR 17X24 6PK STRL BLUE (TOWEL DISPOSABLE) ×1 IMPLANT
WATER STERILE IRR 1000ML POUR (IV SOLUTION) ×1 IMPLANT

## 2022-07-04 NOTE — Progress Notes (Signed)
Patient transferred to the OR for Tubal sterilization.  Transferred via bed and by [facility] RN.    Donah Driver, RN

## 2022-07-04 NOTE — Anesthesia Preprocedure Evaluation (Signed)
Anesthesia Evaluation  Patient identified by MRN, date of birth, ID band Patient awake    Reviewed: Allergy & Precautions, H&P , NPO status , Patient's Chart, lab work & pertinent test results  Airway Mallampati: II  TM Distance: >3 FB Neck ROM: Full    Dental no notable dental hx.    Pulmonary neg pulmonary ROS   Pulmonary exam normal breath sounds clear to auscultation       Cardiovascular hypertension, negative cardio ROS Normal cardiovascular exam Rhythm:Regular Rate:Normal     Neuro/Psych  Headaches  negative psych ROS   GI/Hepatic negative GI ROS, Neg liver ROS,,,  Endo/Other  negative endocrine ROS    Renal/GU negative Renal ROS  negative genitourinary   Musculoskeletal negative musculoskeletal ROS (+)    Abdominal  (+) + obese  Peds negative pediatric ROS (+)  Hematology negative hematology ROS (+)   Anesthesia Other Findings   Reproductive/Obstetrics negative OB ROS                             Anesthesia Physical Anesthesia Plan  ASA: 2  Anesthesia Plan: General and Spinal   Post-op Pain Management:    Induction: Intravenous  PONV Risk Score and Plan: 2 and Treatment may vary due to age or medical condition  Airway Management Planned: Natural Airway  Additional Equipment:   Intra-op Plan:   Post-operative Plan:   Informed Consent: I have reviewed the patients History and Physical, chart, labs and discussed the procedure including the risks, benefits and alternatives for the proposed anesthesia with the patient or authorized representative who has indicated his/her understanding and acceptance.     Dental advisory given  Plan Discussed with: CRNA  Anesthesia Plan Comments:        Anesthesia Quick Evaluation

## 2022-07-04 NOTE — Op Note (Addendum)
Kara Brady 07/04/2022  PREOPERATIVE DIAGNOSIS:  Undesired fertility  POSTOPERATIVE DIAGNOSIS:  Undesired fertility  PROCEDURE:  Postpartum Bilateral Tubal Sterilization using Ligasure   SURGEON:  Dr Clayton Lefort  ASSISTANT: Dr. Gifford Shave  ANESTHESIA:  Epidural  COMPLICATIONS:  None immediate.  ESTIMATED BLOOD LOSS:  Less than 20cc.  FLUIDS: 600 mL LR.  URINE OUTPUT: None recorded   INDICATIONS: 37 y.o. yo [id number]  with undesired fertility,status post vaginal delivery, desires permanent sterilization. Risks and benefits of procedure discussed with patient including permanence of method, bleeding, infection, injury to surrounding organs and need for additional procedures. Risk failure of 0.5-1% with increased risk of ectopic gestation if pregnancy occurs was also discussed with patient.   FINDINGS:  Normal uterus, tubes, and ovaries.  TECHNIQUE:  The patient was taken to the operating room where her epidural anesthesia was dosed up to surgical level and found to be adequate.  She was then placed in the dorsal supine position and prepped and draped in sterile fashion.  After an adequate timeout was performed, attention was turned to the patient's abdomen where a small transverse skin incision was made under the umbilical fold. The incision was taken down to the layer of fascia using the scalpel, and fascia was incised, and extended bilaterally using Mayo scissors. The peritoneum was entered in a sharp fashion.   Attention was then turned to the patient's uterus, and left fallopian tube was identified and followed out to the fimbriated end.  Ligasure device was used to cauterize and cut the mesosalpinx to proximal end of the fallopian tube, removing ~6cm of tube. A similar process was carried out on the right side allowing for bilateral tubal sterilization.    Good hemostasis was noted overall.  The instruments were then removed from the patient's abdomen and the fascial incision  was repaired with 0 Vicryl, and the skin was closed with a 3-0 Monocryl subcuticular stitch. The patient tolerated the procedure well.  Sponge, lap, and needle counts were correct times two.  The patient was then taken to the recovery room awake and in stable condition.   Concepcion Living, MD 07/04/2022 3:09 PM   Attestation of Attending Supervision of Obstetric Fellow: Evaluation and management procedures were performed by the Obstetric Fellow under my supervision and collaboration.  I have reviewed the Obstetric Fellow's note and chart, and I agree with the management and plan as documented.  Clarnce Flock, MD Family Medicine Attending, [facility] for [facility], [facility] 07/06/2022 3:44 PM

## 2022-07-04 NOTE — Progress Notes (Signed)
The RN called Jerilee Hoh MD to obtain Blood pressure parameters . Order given to call if Blood pressure is equal to or greater than 160/110.

## 2022-07-04 NOTE — Progress Notes (Signed)
POSTPARTUM PROGRESS NOTE  Post Partum Day 1  Subjective:  Kara Brady is a 37 y.o. [id number] s/p VD at [redacted]w[redacted]d.  She reports she is doing well. No acute events overnight. She denies any problems with ambulating, voiding or po intake. Denies nausea or vomiting.  Pain is well controlled.  Lochia is minimal. She is scheduled for a post partum BTL today.  Objective: Blood pressure 115/86, pulse 82, temperature 97.8 F (36.6 C), temperature source Oral, resp. rate 18, height 5\' 2"  (1.575 m), weight 97.3 kg, last menstrual period 10/15/2021, SpO2 99 %, unknown if currently breastfeeding.     07/04/2022    5:21 AM 07/03/2022    9:13 PM 07/03/2022    5:30 PM  Vitals with BMI  Systolic [phone]  Diastolic 86 83 82  Pulse 82 86 64     Physical Exam:  General: alert, cooperative and no distress Chest: no respiratory distress Heart:regular rate, distal pulses intact Abdomen: soft, nontender,  Uterine Fundus: firm, appropriately tender DVT Evaluation: No calf swelling or tenderness Extremities: no edema Skin: warm, dry  Recent Labs    07/02/22 2011  HGB 12.8  HCT 37.8    Assessment/Plan: Kara Brady is a 37 y.o. [id number] s/p VD at [redacted]w[redacted]d   PPD#1 - Doing well  Routine postpartum care cHTN - overall well controlled. Single elevated reading last night, but overall good.  Continue nifedipine 30mg  XL daily Continue lasix 20mg  daily Contraception: ppBTL today Feeding: formula Dispo: Plan for discharge tomorrow.   LOS: 2 days   Liliane Channel MD MPH OB Fellow, [place] for [place] 07/04/2022

## 2022-07-04 NOTE — Progress Notes (Signed)
Patient desires permanent sterilization.  Other reversible forms of contraception were discussed with patient; she declines all other modalities. Risks of procedure discussed with patient including but not limited to: risk of regret, permanence of method, bleeding, infection, injury to surrounding organs and need for additional procedures.  Failure risk of about 1% with increased risk of ectopic gestation if pregnancy occurs was also discussed with patient.  Also discussed possibility of post-tubal pain syndrome. The patient concurred with the proposed plan, giving informed written consent for the procedures.  Patient has been NPO since last night she will remain NPO for procedure. Anesthesia and OR aware.  Clarnce Flock, MD/MPH Attending Family Medicine Physician, [facility] for [facility], [place]

## 2022-07-04 NOTE — Anesthesia Procedure Notes (Signed)
Spinal  Patient location during procedure: OB Start time: 07/04/2022 11:01 AM End time: 07/04/2022 11:06 AM Reason for block: surgical anesthesia Staffing Performed: other anesthesia staff  Anesthesiologist: [name], MD Performed by: [name], MD Authorized by: [name], MD   Preanesthetic Checklist Completed: patient identified, IV checked, risks and benefits discussed, surgical consent, monitors and equipment checked, pre-op evaluation and timeout performed Spinal Block Patient position: sitting Prep: DuraPrep and site prepped and draped Patient monitoring: heart rate, cardiac monitor, continuous pulse ox and blood pressure Approach: midline Location: L3-4 Injection technique: single-shot Needle Needle type: Pencan  Needle gauge: 24 G Needle length: 10 cm Assessment Sensory level: T4 Events: CSF return Additional Notes SAB placed by SRNA under direct supervision

## 2022-07-04 NOTE — Transfer of Care (Signed)
Immediate Anesthesia Transfer of Care Note  Patient: Kara Brady  Procedure(s) Performed: POST PARTUM TUBAL LIGATION  Patient Location: PACU  Anesthesia Type:Spinal  Level of Consciousness: awake  Airway & Oxygen Therapy: Patient Spontanous Breathing  Post-op Assessment: Report given to RN  Post vital signs: Reviewed and stable  Last Vitals:  Vitals Value Taken Time  BP    Temp    Pulse 66 07/04/22 1204  Resp    SpO2 93 % 07/04/22 1204  Vitals shown include unvalidated device data.  Last Pain:  Vitals:   07/04/22 0848  TempSrc: Oral  PainSc:          Complications: No notable events documented.

## 2022-07-04 NOTE — Anesthesia Postprocedure Evaluation (Signed)
Anesthesia Post Note  Patient: Kara Brady  Procedure(s) Performed: POST PARTUM TUBAL LIGATION     Patient location during evaluation: PACU Anesthesia Type: Spinal Level of consciousness: awake and alert Pain management: pain level controlled Vital Signs Assessment: post-procedure vital signs reviewed and stable Respiratory status: spontaneous breathing, nonlabored ventilation and respiratory function stable Cardiovascular status: blood pressure returned to baseline and stable Postop Assessment: no apparent nausea or vomiting Anesthetic complications: no   No notable events documented.  Last Vitals:  Vitals:   07/04/22 1300 07/04/22 1310  BP: (!) 142/89 (!) 151/89  Pulse: 62 64  Resp: 15 18  Temp:  36.6 C  SpO2: 93% 96%    Last Pain:  Vitals:   07/04/22 1315  TempSrc:   PainSc: 0-No pain   Pain Goal:    LLE Motor Response: Purposeful movement (07/04/22 1245) LLE Sensation: Tingling (07/04/22 1245) RLE Motor Response: Purposeful movement (07/04/22 1245) RLE Sensation: Tingling (07/04/22 1245)     Epidural/Spinal Function Cutaneous sensation: Tingles (07/04/22 1315), Patient able to flex knees: Yes (07/04/22 1315), Patient able to lift hips off bed: No (07/04/22 1315), Back pain beyond tenderness at insertion site: No (07/04/22 1315), Progressively worsening motor and/or sensory loss: No (07/04/22 1315), Bowel and/or bladder incontinence post epidural: No (07/04/22 1315)  [name]

## 2022-07-04 NOTE — Social Work (Signed)
CSW received consult for hx of Anxiety, Depression and an [place] Postnatal Depression Screen score of 17. CSW met with MOB to offer support and complete assessment.  CSW entered the room observed MOB in bed resting, FOB at bedside and the infant in the bassinet. CSW introduced self, CSW role and reason for visit. MOB was agreeable to visit and allowed FOB to remain in the room. CSW inquired about how MOB was feeling, MOB reported good just tired.  CSW inquired about MOB mood the past 7 days, MOB reported she has 3 other children and trying to prepare for another  was overwhelming. MOB reported also dealing with work and house duties was stressful. CSW voiced understanding. CSW inquired about MOD noted Anxiety and Depression, MOB does not recall when she was diagnosed but has had a stable mood throughout her pregnancy until she last week. MOB reported he does not have symptoms usually but when she does she talks with her mom and dad and tries to get out of there house. MOB reported she does not take any medication or participate in therapy at this time. CSW assessed for safety, MOB denied any SI or HI. CSW provided education regarding the baby blues period vs. perinatal mood disorders, discussed treatment and gave resources for mental health follow up if concerns arise.  CSW recommends self-evaluation during the postpartum time period using the New Mom Checklist from Postpartum Progress and encouraged MOB to contact a medical professional if symptoms are noted at any time. MOB reported her supports are her mom, dad and spouse.    CSW provided review of Sudden Infant Death Syndrome (SIDS) precautions.  MOB reported they have all necessary items for the infant including a bassinet for him to sleep. CSW identifies no further need for intervention and no barriers to discharge at this time.  Letta Kocher, [place] Social Worker [phone]

## 2022-07-05 ENCOUNTER — Ambulatory Visit: Payer: BC Managed Care – PPO

## 2022-07-05 ENCOUNTER — Other Ambulatory Visit (HOSPITAL_COMMUNITY): Payer: Self-pay

## 2022-07-05 ENCOUNTER — Encounter: Payer: BC Managed Care – PPO | Admitting: Obstetrics and Gynecology

## 2022-07-05 MED ORDER — ACETAMINOPHEN 325 MG PO TABS
650.0000 mg | ORAL_TABLET | ORAL | 0 refills | Status: AC | PRN
  Filled 2022-07-05: qty 60, 5d supply, fill #0

## 2022-07-05 MED ORDER — IBUPROFEN 600 MG PO TABS
600.0000 mg | ORAL_TABLET | Freq: Four times a day (QID) | ORAL | 0 refills | Status: AC
  Filled 2022-07-05: qty 30, 8d supply, fill #0

## 2022-07-05 MED ORDER — BENZOCAINE-MENTHOL 20-0.5 % EX AERO
1.0000 | INHALATION_SPRAY | CUTANEOUS | 0 refills | Status: AC | PRN
  Filled 2022-07-05: qty 78, fill #0

## 2022-07-05 MED ORDER — FUROSEMIDE 20 MG PO TABS
20.0000 mg | ORAL_TABLET | Freq: Every day | ORAL | 0 refills | Status: DC
  Filled 2022-07-05: qty 4, 4d supply, fill #0

## 2022-07-05 MED ORDER — NIFEDIPINE ER 60 MG PO TB24
60.0000 mg | ORAL_TABLET | Freq: Every day | ORAL | 3 refills | Status: AC
  Filled 2022-07-05: qty 30, 30d supply, fill #0

## 2022-07-05 MED ORDER — OXYCODONE HCL 5 MG PO TABS
5.0000 mg | ORAL_TABLET | ORAL | 0 refills | Status: DC | PRN
  Filled 2022-07-05: qty 15, 3d supply, fill #0

## 2022-07-06 ENCOUNTER — Telehealth (HOSPITAL_COMMUNITY): Payer: Self-pay | Admitting: *Deleted

## 2022-07-06 DIAGNOSIS — Z1331 Encounter for screening for depression: Secondary | ICD-10-CM

## 2022-07-06 LAB — SURGICAL PATHOLOGY

## 2022-07-06 NOTE — Telephone Encounter (Signed)
Inpatient EPDS=17. CSW consult completed during hospitalization. Ambulatory IBH referral made now. Dr  Si Raider notified via chart.  Odis Hollingshead, RN 07-06-2022 at 9:02am

## 2022-07-07 ENCOUNTER — Telehealth: Payer: Self-pay | Admitting: *Deleted

## 2022-07-07 NOTE — Telephone Encounter (Signed)
Left patient an urgent message to call and schedule 6 week Postpartum appointment. The office changed patient appointment on 07/12/2022 from an OB to a 1 week Postpartum BP check.

## 2022-07-12 ENCOUNTER — Encounter: Payer: Self-pay | Admitting: Obstetrics and Gynecology

## 2022-07-12 ENCOUNTER — Ambulatory Visit (INDEPENDENT_AMBULATORY_CARE_PROVIDER_SITE_OTHER): Payer: BC Managed Care – PPO | Admitting: Obstetrics and Gynecology

## 2022-07-12 VITALS — BP 131/87 | HR 74 | Ht 62.0 in | Wt 187.0 lb

## 2022-07-12 DIAGNOSIS — Z0130 Encounter for examination of blood pressure without abnormal findings: Secondary | ICD-10-CM

## 2022-07-12 NOTE — Progress Notes (Signed)
Ms.Kara Brady is a 37 y.o. female [id number] status post vaginal delivery with postpartum bilateral tubal sterilization. Her labor was induced @ 37 weeks 2/2 cHTN on labetalol. She is doing well. She is taking nifedipine 60 mg XL daily. She completed 5 days of lasix. She has no vision changes or HA currently. She does report occasional HA in the afternoons, however goes away on its own without treatment.    Vitals:   07/12/22 1343  BP: 131/87  Pulse: 74  Height: 5\' 2"  (1.575 m)  Weight: 187 lb (84.8 kg)  BMI (Calculated): 34.19     BP normal Continue nifedipine. Pre E precautions reviewed.  Noni Saupe I, NP 07/12/2022 2:09 PM

## 2022-07-12 NOTE — Progress Notes (Signed)
Pt states she does have headaches but go away without meds

## 2022-07-15 ENCOUNTER — Encounter (HOSPITAL_COMMUNITY): Payer: BC Managed Care – PPO

## 2022-07-18 ENCOUNTER — Encounter: Payer: BC Managed Care – PPO | Admitting: Obstetrics and Gynecology

## 2022-07-20 ENCOUNTER — Other Ambulatory Visit: Payer: Self-pay | Admitting: *Deleted

## 2022-07-20 MED ORDER — LISINOPRIL-HYDROCHLOROTHIAZIDE 10-12.5 MG PO TABS: 1.0000 | ORAL_TABLET | Freq: Every day | ORAL | 11 refills | Status: AC

## 2022-07-20 NOTE — Progress Notes (Signed)
RX of Lisinopril/HCTZ sent to [facility] [place] per J XFGHW,EX

## 2022-08-15 NOTE — Progress Notes (Unsigned)
    Kara Brady  Kara Brady is a 37 y.o. [id number] female who presents for a postpartum visit. She is 6 weeks postpartum following a normal spontaneous vaginal delivery.  I have fully reviewed the prenatal and intrapartum course. The delivery was at 37.2 gestational weeks.  Anesthesia: none. Postpartum course has been unremarkable. Baby is doing well. Baby is feeding by {breastmilk/bottle:69}. Bleeding {vag bleed:12292}. Bowel function is {normal:32111}. Bladder function is {normal:32111}. Patient {is/is not:9024} sexually active. Contraception method is tubal ligation. Postpartum depression screening: negative- score 6.   The pregnancy intention screening data noted above was reviewed. Potential methods of contraception were discussed. The patient elected to proceed with No data recorded.    Health Maintenance Due  Topic Date Due   COVID-19 Vaccine (1) Never done    {Common ambulatory SmartLinks:19316}  Review of Systems {ros; complete:30496}  Objective:  There were no vitals taken for this visit.   General:  {gen appearance:16600}   Breasts:  {desc; normal/abnormal/not indicated:14647}  Lungs: {lung exam:16931}  Heart:  {heart exam:5510}  Abdomen: {abdomen exam:16834}   Wound {Wound assessment:11097}  GU exam:  {desc; normal/abnormal/not indicated:14647}       Assessment:    There are no diagnoses linked to this encounter.  *** postpartum exam.   Plan:   Essential components of care per ACOG recommendations:  1.  Mood and well being: Patient with {gen negative/positive:315881} depression screening today. Reviewed local resources for support.  - Patient tobacco use? {tobacco use:25506}  - hx of drug use? {yes/no:25505}    2. Infant care and feeding:  -Patient currently breastmilk feeding? {yes/no:25502}  -Social determinants of health (SDOH) reviewed in EPIC. No concerns***The following needs were identified***  3. Sexuality, contraception and birth  spacing - Patient {DOES_DOES NF:2365131 want a pregnancy in the next year.  Desired family size is {NUMBER 1-10:22536} children.  - Reviewed reproductive life planning. Reviewed contraceptive methods based on pt preferences and effectiveness.  Patient desired {Up[employer] End Methods:24109} today.   - Discussed birth spacing of 18 months  4. Sleep and fatigue -Encouraged family/partner/community support of 4 hrs of uninterrupted sleep to help with mood and fatigue  5. Physical Recovery  - Discussed patients delivery and complications. She describes her labor as {description:25511} - Patient had a {CHL AMB [phone]}. Patient had a {laceration:25518} laceration. Perineal healing reviewed. Patient expressed understanding - Patient has urinary incontinence? {yes/no:25515} - Patient {ACTION; IS/IS VG:[id number] safe to resume physical and sexual activity  6.  Health Maintenance - HM due items addressed {Yes or If no, why not?:20788} - Last pap smear  Diagnosis  Date Value Ref Range Status  02/25/2021   Final   - Negative for intraepithelial lesion or malignancy (NILM)   Pap smear {done:10129} at today's visit.  -Breast Cancer screening indicated? {indicated:25516}  7. Chronic Disease/Pregnancy Condition follow up: {Follow up:25499}  - PCP follow up  Lyndal Rainbow, [place] for [employer], [place]

## 2022-08-16 ENCOUNTER — Ambulatory Visit (INDEPENDENT_AMBULATORY_CARE_PROVIDER_SITE_OTHER): Payer: BC Managed Care – PPO

## 2022-10-07 DIAGNOSIS — Z13220 Encounter for screening for lipoid disorders: Secondary | ICD-10-CM | POA: Diagnosis not present

## 2022-10-07 DIAGNOSIS — Z0000 Encounter for general adult medical examination without abnormal findings: Secondary | ICD-10-CM | POA: Diagnosis not present

## 2022-10-07 DIAGNOSIS — R5383 Other fatigue: Secondary | ICD-10-CM | POA: Diagnosis not present

## 2022-10-07 DIAGNOSIS — Z1330 Encounter for screening examination for mental health and behavioral disorders, unspecified: Secondary | ICD-10-CM | POA: Diagnosis not present

## 2022-10-07 DIAGNOSIS — D1801 Hemangioma of skin and subcutaneous tissue: Secondary | ICD-10-CM | POA: Diagnosis not present

## 2022-10-07 DIAGNOSIS — Z1329 Encounter for screening for other suspected endocrine disorder: Secondary | ICD-10-CM | POA: Diagnosis not present

## 2022-10-07 DIAGNOSIS — I10 Essential (primary) hypertension: Secondary | ICD-10-CM | POA: Diagnosis not present

## 2023-04-07 DIAGNOSIS — I10 Essential (primary) hypertension: Secondary | ICD-10-CM | POA: Diagnosis not present

## 2023-11-17 ENCOUNTER — Other Ambulatory Visit (HOSPITAL_COMMUNITY): Payer: Self-pay
